# Patient Record
Sex: Female | Born: 1985 | Race: Black or African American | Hispanic: No | Marital: Single | Smoking: Current every day smoker
Health system: Southern US, Community
[De-identification: ages and names within clinical notes are randomized; demographics above are authoritative.]

## PROBLEM LIST (undated history)

## (undated) ENCOUNTER — Inpatient Hospital Stay (HOSPITAL_COMMUNITY): Payer: Self-pay

## (undated) DIAGNOSIS — K219 Gastro-esophageal reflux disease without esophagitis: Secondary | ICD-10-CM

## (undated) DIAGNOSIS — I10 Essential (primary) hypertension: Secondary | ICD-10-CM

---
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
## 2007-01-27 DIAGNOSIS — E669 Obesity, unspecified: Secondary | ICD-10-CM | POA: Insufficient documentation

## 2007-01-27 DIAGNOSIS — E119 Type 2 diabetes mellitus without complications: Secondary | ICD-10-CM | POA: Insufficient documentation

## 2007-04-26 ENCOUNTER — Telehealth (INDEPENDENT_AMBULATORY_CARE_PROVIDER_SITE_OTHER): Payer: Self-pay | Admitting: Family Medicine

## 2007-06-11 ENCOUNTER — Emergency Department (HOSPITAL_COMMUNITY): Admission: EM | Admit: 2007-06-11 | Discharge: 2007-06-11 | Payer: Self-pay | Admitting: Emergency Medicine

## 2008-05-18 ENCOUNTER — Encounter (INDEPENDENT_AMBULATORY_CARE_PROVIDER_SITE_OTHER): Payer: Self-pay | Admitting: Family Medicine

## 2008-05-18 ENCOUNTER — Ambulatory Visit: Payer: Self-pay | Admitting: Family Medicine

## 2008-05-18 ENCOUNTER — Telehealth: Payer: Self-pay | Admitting: *Deleted

## 2008-05-18 DIAGNOSIS — A630 Anogenital (venereal) warts: Secondary | ICD-10-CM | POA: Insufficient documentation

## 2008-05-18 LAB — CONVERTED CEMR LAB: Beta hcg, urine, semiquantitative: NEGATIVE

## 2008-05-19 LAB — CONVERTED CEMR LAB: GC Probe Amp, Genital: POSITIVE — AB

## 2008-05-21 ENCOUNTER — Encounter: Payer: Self-pay | Admitting: *Deleted

## 2008-05-25 ENCOUNTER — Encounter (INDEPENDENT_AMBULATORY_CARE_PROVIDER_SITE_OTHER): Payer: Self-pay | Admitting: *Deleted

## 2008-07-24 ENCOUNTER — Telehealth: Payer: Self-pay | Admitting: *Deleted

## 2008-07-24 ENCOUNTER — Ambulatory Visit: Payer: Self-pay | Admitting: Family Medicine

## 2008-07-24 DIAGNOSIS — E785 Hyperlipidemia, unspecified: Secondary | ICD-10-CM | POA: Insufficient documentation

## 2008-07-24 LAB — CONVERTED CEMR LAB: Beta hcg, urine, semiquantitative: POSITIVE

## 2008-08-03 ENCOUNTER — Telehealth (INDEPENDENT_AMBULATORY_CARE_PROVIDER_SITE_OTHER): Payer: Self-pay | Admitting: *Deleted

## 2008-08-07 ENCOUNTER — Encounter (INDEPENDENT_AMBULATORY_CARE_PROVIDER_SITE_OTHER): Payer: Self-pay | Admitting: Family Medicine

## 2008-08-09 ENCOUNTER — Ambulatory Visit: Payer: Self-pay | Admitting: Family Medicine

## 2008-08-09 ENCOUNTER — Encounter (INDEPENDENT_AMBULATORY_CARE_PROVIDER_SITE_OTHER): Payer: Self-pay | Admitting: Family Medicine

## 2008-08-09 LAB — CONVERTED CEMR LAB
Basophils Relative: 0 % (ref 0–1)
Eosinophils Relative: 1 % (ref 0–5)
Hemoglobin: 13.1 g/dL (ref 12.0–15.0)
MCHC: 34 g/dL (ref 30.0–36.0)
Monocytes Absolute: 1 10*3/uL (ref 0.1–1.0)
Monocytes Relative: 7 % (ref 3–12)
RDW: 13.9 % (ref 11.5–15.5)
Rubella: 15.5 intl units/mL — ABNORMAL HIGH
Sickle Cell Screen: NEGATIVE

## 2008-08-10 ENCOUNTER — Encounter (INDEPENDENT_AMBULATORY_CARE_PROVIDER_SITE_OTHER): Payer: Self-pay | Admitting: Family Medicine

## 2008-08-29 ENCOUNTER — Encounter (INDEPENDENT_AMBULATORY_CARE_PROVIDER_SITE_OTHER): Payer: Self-pay | Admitting: Family Medicine

## 2008-08-29 ENCOUNTER — Ambulatory Visit: Payer: Self-pay | Admitting: Family Medicine

## 2008-08-29 ENCOUNTER — Other Ambulatory Visit: Admission: RE | Admit: 2008-08-29 | Discharge: 2008-08-29 | Payer: Self-pay | Admitting: Family Medicine

## 2008-08-29 LAB — CONVERTED CEMR LAB
Chlamydia, DNA Probe: NEGATIVE
Cholesterol: 171 mg/dL (ref 0–200)
Hgb A1c MFr Bld: 7.1 %
Triglycerides: 57 mg/dL (ref <150)
Whiff Test: POSITIVE

## 2008-09-03 ENCOUNTER — Telehealth: Payer: Self-pay | Admitting: *Deleted

## 2008-09-05 ENCOUNTER — Encounter (INDEPENDENT_AMBULATORY_CARE_PROVIDER_SITE_OTHER): Payer: Self-pay | Admitting: Family Medicine

## 2008-09-13 ENCOUNTER — Telehealth (INDEPENDENT_AMBULATORY_CARE_PROVIDER_SITE_OTHER): Payer: Self-pay | Admitting: Family Medicine

## 2008-09-13 ENCOUNTER — Ambulatory Visit (HOSPITAL_COMMUNITY): Admission: RE | Admit: 2008-09-13 | Discharge: 2008-09-13 | Payer: Self-pay | Admitting: Family Medicine

## 2008-09-13 ENCOUNTER — Encounter (INDEPENDENT_AMBULATORY_CARE_PROVIDER_SITE_OTHER): Payer: Self-pay | Admitting: Family Medicine

## 2008-09-13 ENCOUNTER — Encounter (INDEPENDENT_AMBULATORY_CARE_PROVIDER_SITE_OTHER): Payer: Self-pay | Admitting: *Deleted

## 2008-09-17 ENCOUNTER — Inpatient Hospital Stay (HOSPITAL_COMMUNITY): Admission: AD | Admit: 2008-09-17 | Discharge: 2008-09-20 | Payer: Self-pay | Admitting: Obstetrics & Gynecology

## 2008-09-17 ENCOUNTER — Ambulatory Visit: Payer: Self-pay | Admitting: Obstetrics & Gynecology

## 2008-09-17 ENCOUNTER — Ambulatory Visit: Payer: Self-pay | Admitting: Family Medicine

## 2008-09-17 ENCOUNTER — Encounter: Payer: Self-pay | Admitting: Obstetrics & Gynecology

## 2008-09-17 ENCOUNTER — Encounter: Payer: Self-pay | Admitting: Family Medicine

## 2008-09-27 ENCOUNTER — Ambulatory Visit: Payer: Self-pay | Admitting: Obstetrics & Gynecology

## 2008-10-15 ENCOUNTER — Ambulatory Visit: Payer: Self-pay | Admitting: Obstetrics & Gynecology

## 2008-10-15 ENCOUNTER — Encounter: Admission: RE | Admit: 2008-10-15 | Discharge: 2009-01-13 | Payer: Self-pay | Admitting: Obstetrics & Gynecology

## 2008-10-15 ENCOUNTER — Encounter: Payer: Self-pay | Admitting: Family Medicine

## 2008-10-15 ENCOUNTER — Ambulatory Visit (HOSPITAL_COMMUNITY): Admission: RE | Admit: 2008-10-15 | Discharge: 2008-10-15 | Payer: Self-pay | Admitting: Obstetrics & Gynecology

## 2008-10-15 LAB — CONVERTED CEMR LAB
Chlamydia, DNA Probe: NEGATIVE
GC Probe Amp, Genital: NEGATIVE

## 2008-10-24 ENCOUNTER — Emergency Department (HOSPITAL_COMMUNITY): Admission: EM | Admit: 2008-10-24 | Discharge: 2008-10-24 | Payer: Self-pay | Admitting: Emergency Medicine

## 2008-10-29 ENCOUNTER — Ambulatory Visit: Payer: Self-pay | Admitting: Family Medicine

## 2008-10-30 ENCOUNTER — Ambulatory Visit (HOSPITAL_COMMUNITY): Admission: RE | Admit: 2008-10-30 | Discharge: 2008-10-30 | Payer: Self-pay | Admitting: Family Medicine

## 2008-11-19 ENCOUNTER — Ambulatory Visit: Payer: Self-pay | Admitting: Family Medicine

## 2008-11-19 LAB — CONVERTED CEMR LAB: Hgb A1c MFr Bld: 6 % (ref 4.6–6.1)

## 2008-12-12 ENCOUNTER — Ambulatory Visit (HOSPITAL_COMMUNITY): Admission: RE | Admit: 2008-12-12 | Discharge: 2008-12-12 | Payer: Self-pay | Admitting: Family Medicine

## 2008-12-20 ENCOUNTER — Ambulatory Visit: Payer: Self-pay | Admitting: Family Medicine

## 2009-01-07 ENCOUNTER — Encounter: Payer: Self-pay | Admitting: Family Medicine

## 2009-01-07 ENCOUNTER — Ambulatory Visit: Payer: Self-pay | Admitting: Obstetrics & Gynecology

## 2009-01-07 LAB — CONVERTED CEMR LAB
HCT: 36.6 % (ref 36.0–46.0)
Platelets: 331 10*3/uL (ref 150–400)
RBC: 3.81 M/uL — ABNORMAL LOW (ref 3.87–5.11)
RDW: 13.8 % (ref 11.5–15.5)
WBC: 11.6 10*3/uL — ABNORMAL HIGH (ref 4.0–10.5)

## 2009-01-15 ENCOUNTER — Ambulatory Visit (HOSPITAL_COMMUNITY): Admission: RE | Admit: 2009-01-15 | Discharge: 2009-01-15 | Payer: Self-pay | Admitting: Family Medicine

## 2009-01-28 ENCOUNTER — Ambulatory Visit: Payer: Self-pay | Admitting: Obstetrics & Gynecology

## 2009-02-11 ENCOUNTER — Ambulatory Visit: Payer: Self-pay | Admitting: Obstetrics & Gynecology

## 2009-02-11 ENCOUNTER — Encounter: Admission: RE | Admit: 2009-02-11 | Discharge: 2009-02-25 | Payer: Self-pay | Admitting: Obstetrics & Gynecology

## 2009-02-19 ENCOUNTER — Ambulatory Visit: Payer: Self-pay | Admitting: Obstetrics & Gynecology

## 2009-02-26 ENCOUNTER — Ambulatory Visit: Payer: Self-pay | Admitting: Obstetrics & Gynecology

## 2009-02-26 ENCOUNTER — Inpatient Hospital Stay (HOSPITAL_COMMUNITY): Admission: AD | Admit: 2009-02-26 | Discharge: 2009-03-04 | Payer: Self-pay | Admitting: Obstetrics & Gynecology

## 2009-02-26 ENCOUNTER — Ambulatory Visit: Payer: Self-pay | Admitting: Family Medicine

## 2009-02-27 ENCOUNTER — Encounter: Payer: Self-pay | Admitting: Obstetrics & Gynecology

## 2009-03-01 ENCOUNTER — Encounter: Payer: Self-pay | Admitting: Family Medicine

## 2009-03-21 ENCOUNTER — Ambulatory Visit: Payer: Self-pay | Admitting: Family Medicine

## 2009-08-17 ENCOUNTER — Encounter (INDEPENDENT_AMBULATORY_CARE_PROVIDER_SITE_OTHER): Payer: Self-pay | Admitting: *Deleted

## 2009-08-17 DIAGNOSIS — F17200 Nicotine dependence, unspecified, uncomplicated: Secondary | ICD-10-CM | POA: Insufficient documentation

## 2010-01-30 ENCOUNTER — Encounter: Payer: Self-pay | Admitting: Family Medicine

## 2010-01-30 ENCOUNTER — Encounter: Payer: Self-pay | Admitting: Sports Medicine

## 2010-01-30 ENCOUNTER — Ambulatory Visit: Payer: Self-pay | Admitting: Family Medicine

## 2010-01-30 DIAGNOSIS — A599 Trichomoniasis, unspecified: Secondary | ICD-10-CM | POA: Insufficient documentation

## 2010-01-30 LAB — CONVERTED CEMR LAB
Bilirubin Urine: NEGATIVE
Blood in Urine, dipstick: NEGATIVE
Glucose, Urine, Semiquant: 100
Nitrite: NEGATIVE
Protein, U semiquant: NEGATIVE
Specific Gravity, Urine: 1.025
Urobilinogen, UA: 0.2
Whiff Test: POSITIVE
pH: 7

## 2010-01-31 ENCOUNTER — Telehealth: Payer: Self-pay | Admitting: *Deleted

## 2010-01-31 DIAGNOSIS — Z8619 Personal history of other infectious and parasitic diseases: Secondary | ICD-10-CM | POA: Insufficient documentation

## 2010-01-31 LAB — CONVERTED CEMR LAB
Chlamydia, DNA Probe: POSITIVE — AB
GC Probe Amp, Genital: NEGATIVE

## 2010-02-03 ENCOUNTER — Ambulatory Visit: Payer: Self-pay | Admitting: Family Medicine

## 2010-02-17 ENCOUNTER — Emergency Department (HOSPITAL_COMMUNITY): Admission: EM | Admit: 2010-02-17 | Discharge: 2010-02-17 | Payer: Self-pay | Admitting: Emergency Medicine

## 2010-02-20 ENCOUNTER — Emergency Department (HOSPITAL_COMMUNITY): Admission: EM | Admit: 2010-02-20 | Discharge: 2010-02-20 | Payer: Self-pay | Admitting: Emergency Medicine

## 2010-05-07 ENCOUNTER — Encounter: Payer: Self-pay | Admitting: Family Medicine

## 2010-07-04 IMAGING — US US OB COMP LESS 14 WK
1 series · 14 of 24 positions shown · non-contrast
Comparison: none

OBSTETRICAL ULTRASOUND:
 This ultrasound exam was performed in the [HOSPITAL] Ultrasound Department.  The OB US report was generated in the AS system, and faxed to the ordering physician.  This report is also available in [REDACTED] PACS.

[Series 1: us ob comp less 14 wks · 14 of 24 slices shown]
[im 1/24]
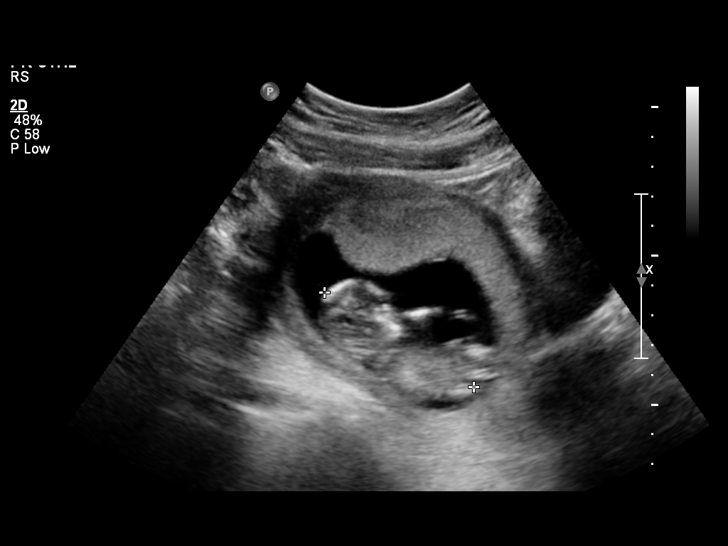
[im 3/24]
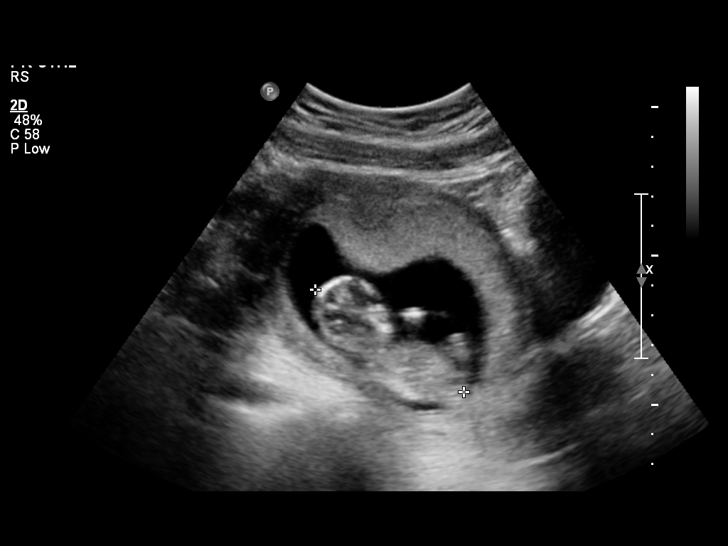
[im 5/24]
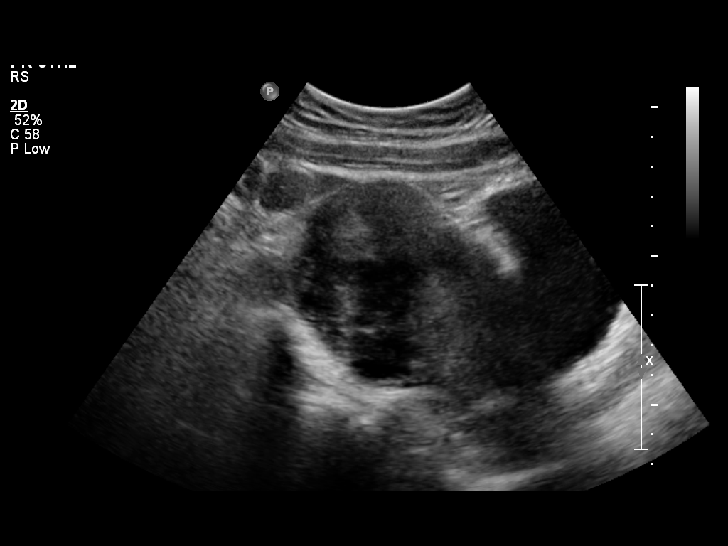
[im 7/24]
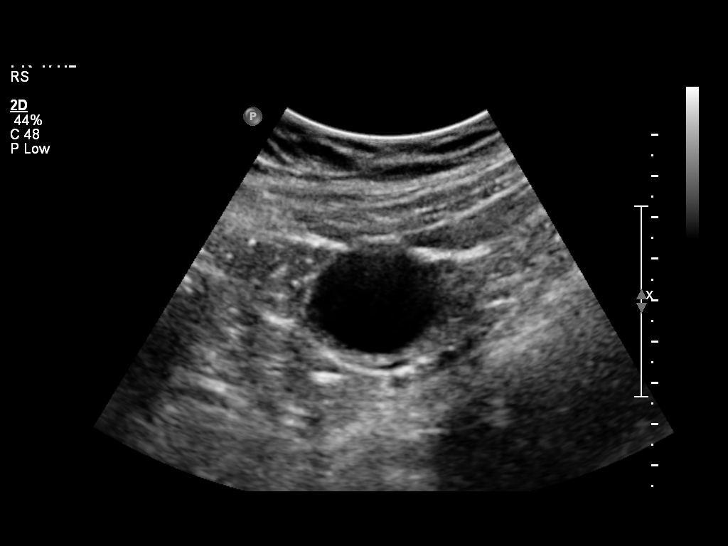
[im 8/24]
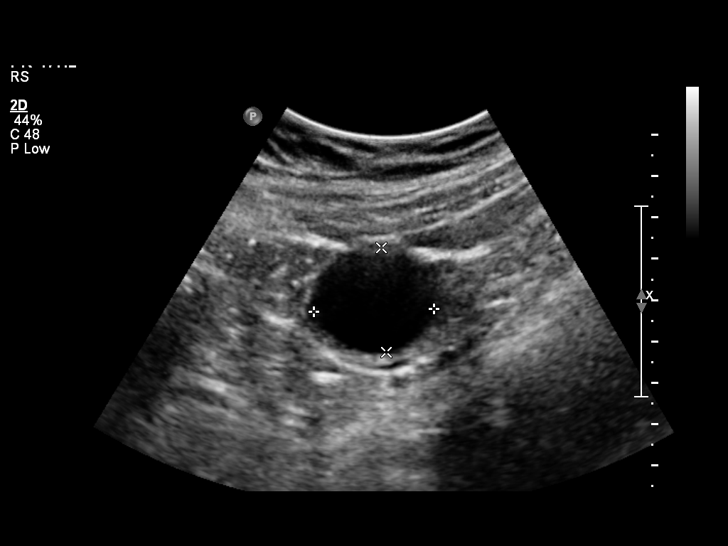
[im 10/24]
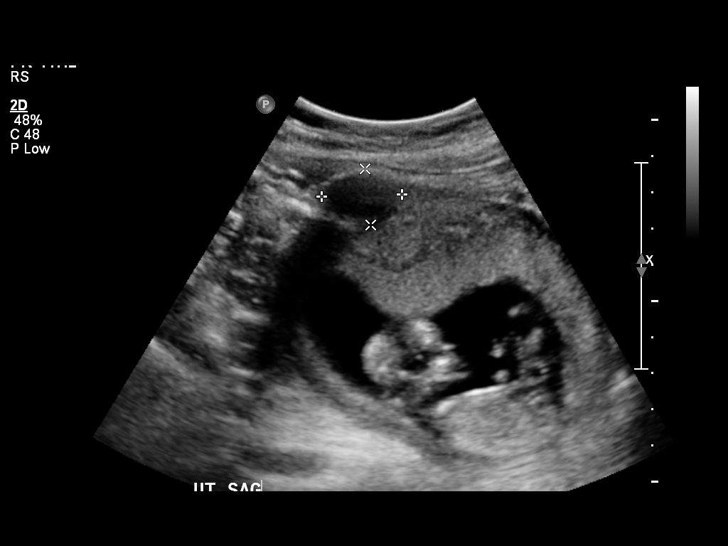
[im 12/24]
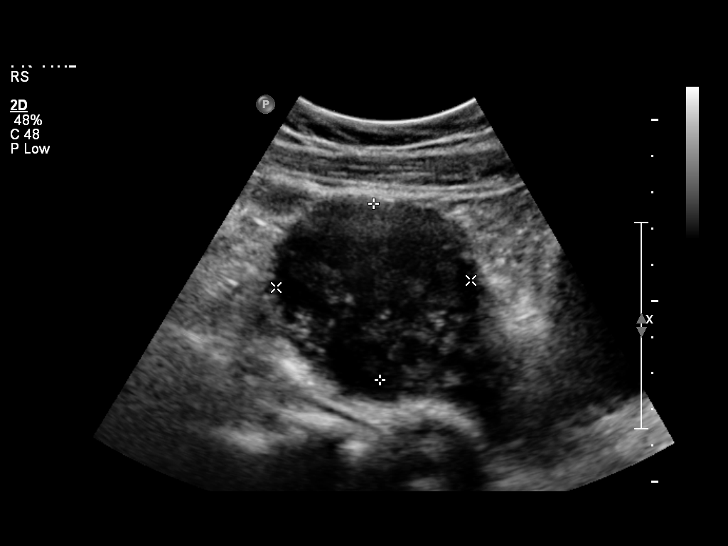
[im 13/24]
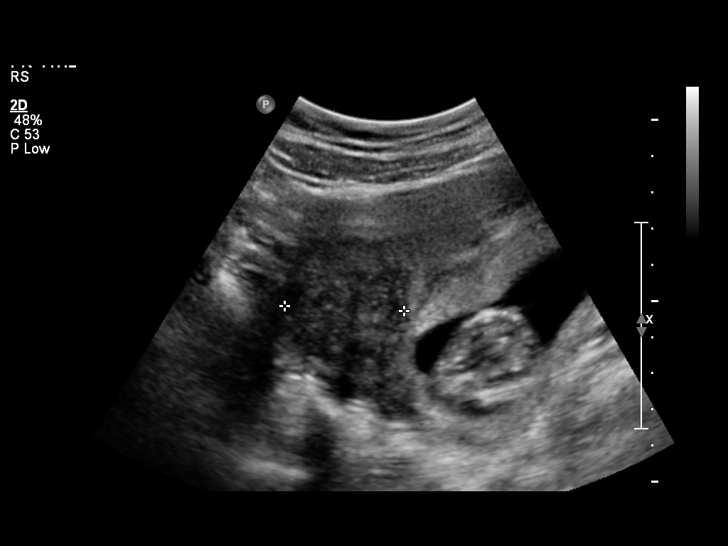
[im 15/24]
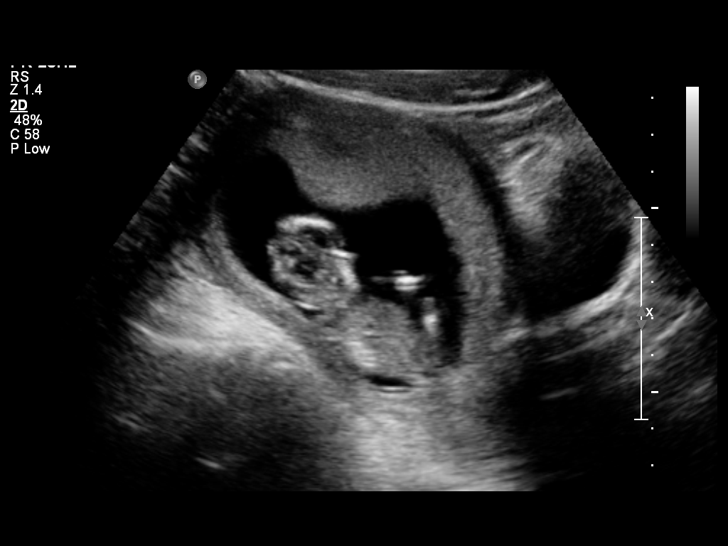
[im 17/24]
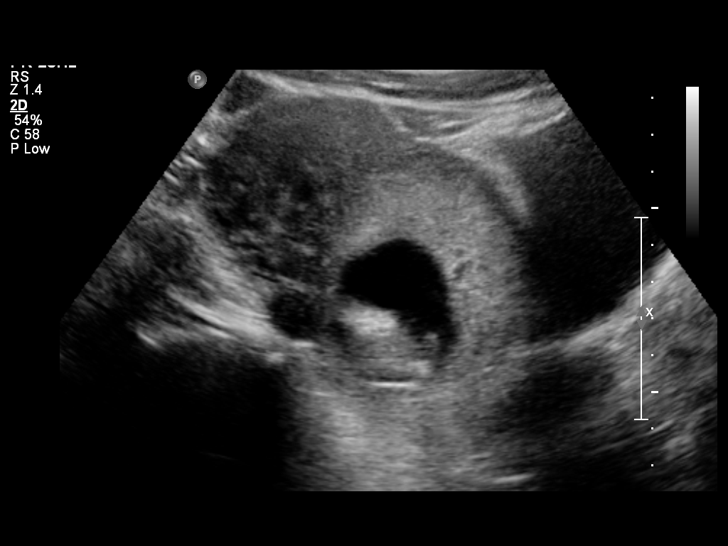
[im 19/24]
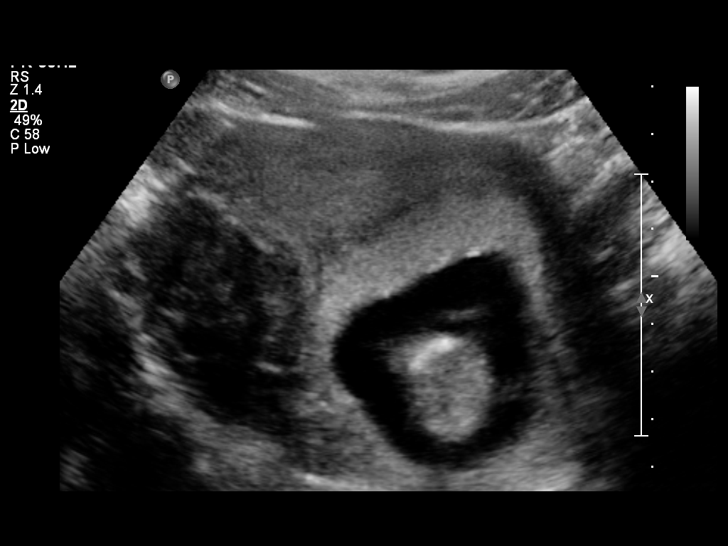
[im 20/24]
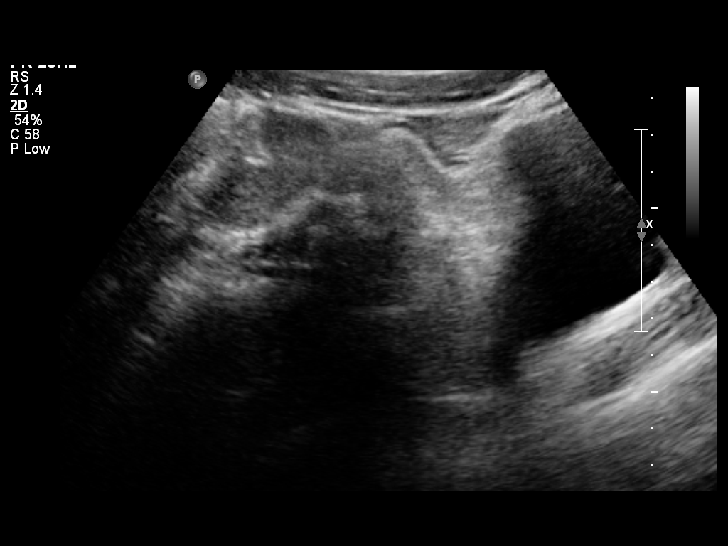
[im 22/24]
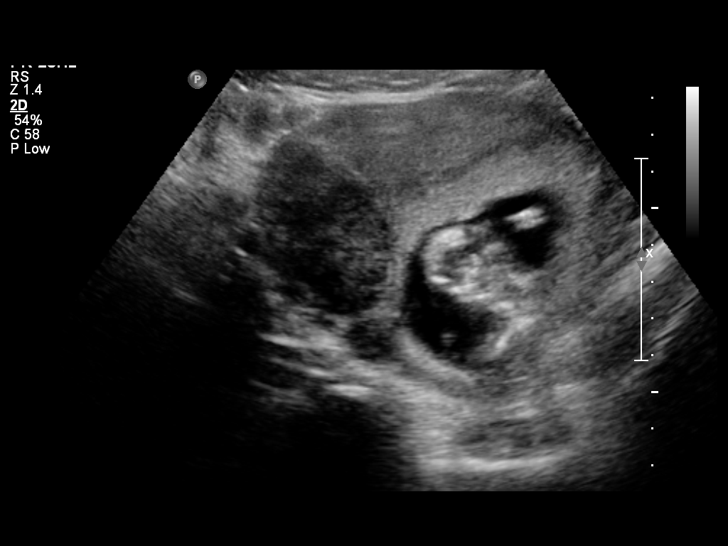
[im 24/24]
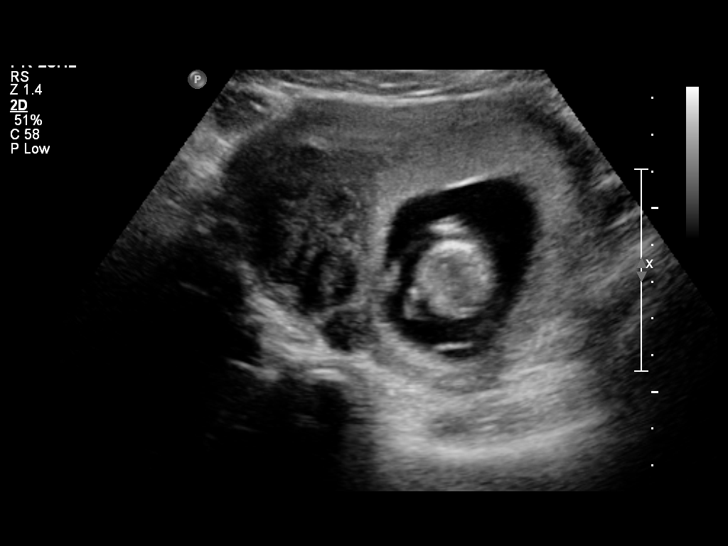

[14 of 24 positions shown; findings below may reference images not displayed]

IMPRESSION: See AS Obstetric US report.

## 2010-07-15 ENCOUNTER — Ambulatory Visit: Payer: Self-pay | Admitting: Family Medicine

## 2010-07-18 ENCOUNTER — Ambulatory Visit: Payer: Self-pay | Admitting: Family Medicine

## 2010-12-21 ENCOUNTER — Encounter: Payer: Self-pay | Admitting: *Deleted

## 2011-01-01 NOTE — Letter (Signed)
Summary: Generic Letter  [facility]  [address]   [place], [place] [address]   Phone: [phone]  Fax: [phone]    05/07/2010  SAKEENA TEALL [address] [place], [place]  [address]  Dear Ms. Shiley,    We have not seen you for routine medical care in over a year.  It is important to receive regular medical care for your diabetes and cholesterol.  Please call to schedule an appointment.       Sincerely,   Eustaquio Boyden  MD  Appended Document: Generic Letter letter mailed.

## 2011-01-01 NOTE — Assessment & Plan Note (Signed)
Summary: std check/Shellman/Gutierrez's   Vital Signs:  Patient profile:   25 year old female Height:      64 inches Weight:      193 pounds Pulse rate:   89 / minute BP sitting:   141 / 92  Vitals Entered By: Jone Baseman CMA (January 30, 2010 10:52 AM) CC: STD test Pain Assessment Patient in pain? no        Primary Care Provider:  Sylvan Cheese MD  CC:  STD test.  History of Present Illness: VAGINAL DISCHARGE Onset: 1 week Description: yellowish, smelly.  Symptoms Odor: yes, offensive Itching: no Vaginal burning: no Dysuria: no Bleeding: no Pelvic pain: no Back pain: no Fever: no Genital sores: no Rash: no Dyspareunia: no GI Symptoms: no  Red Flags:  Missed period: no Recent antibiotics: no Possible STD exposure: no IUD: no Diabetes: yes, well controlled A1c <6.  No N/V/D/fevers/chills.  Last intercourse 1 week ago, always uses condoms.  Last period ended last week.     Habits & Providers  Alcohol-Tobacco-Diet     Tobacco Status: current     Tobacco Counseling: to quit use of tobacco products     Cigarette Packs/Day: 0.25  Current Medications (verified): 1)  Maternity  Tabs (Prenatal Vit-Sel-Fe Fum-Fa) .Marland Kitchen.. 1 Tablet By Mouth Daily 2)  Sprintec 28 0.25-35 Mg-Mcg Tabs (Norgestimate-Eth Estradiol) .Marland Kitchen.. 1 Tablet By Mouth Daily For Birth Control 3)  Metformin Hcl 500 Mg Tabs (Metformin Hcl) .Marland Kitchen.. 1 Tablet By Mouth Two Times A Day For Diabetes  Allergies (verified): No Known Drug Allergies  Social History: Packs/Day:  0.25  Review of Systems       See HPI  Physical Exam  General:  Well-developed,well-nourished,in no acute distress; alert,appropriate and cooperative throughout examination Abdomen:  Bowel sounds positive,abdomen soft and non-tender without masses, organomegaly or hernias noted. Genitalia:  Normal introitus for age, no external lesions, no vaginal discharge, mucosa pink and moist, no vaginal or cervical lesions, no vaginal atrophy, no  friaility or hemorrhage, normal uterus size and position, no adnexal masses or tenderness   Impression & Recommendations:  Problem # 1:  SEXUALLY TRANSMITTED DISEASE, EXPOSURE TO (ICD-V01.6) Assessment New Trich on urine specimen.  Also clues.  Will tx with flagyl 500 two times a day x 7 d to cover BV and trich.  Called and discussed this with pt on phone.  She will go pick up her scripts. Will discuss tx of sex partner with PCP.  Awaiting further bloodwork.  Orders: Wet Prep- [facility] [phone]) GC/Chlamydia-[facility] (87591/87491) Urinalysis-[facility] (00000) HIV-[facility] (95621-30865) HSV 2- [facility] (78469-62952) RPR-[facility] (84132-44010) [facility]- Est Level  3 (27[address])  Problem # 2:  TRICHOMONIASIS (ICD-131.9) Assessment: New See #1.  Complete Medication List: 1)  Maternity Tabs (Prenatal vit-sel-fe fum-fa) .Marland Kitchen.. 1 tablet by mouth daily 2)  Sprintec 28 0.25-35 Mg-mcg Tabs (Norgestimate-eth estradiol) .Marland Kitchen.. 1 tablet by mouth daily for birth control 3)  Metformin Hcl 500 Mg Tabs (Metformin hcl) .Marland Kitchen.. 1 tablet by mouth two times a day for diabetes 4)  Metronidazole 500 Mg Tabs (Metronidazole) .... One tab by mouth two times a day x 7 days Prescriptions: METRONIDAZOLE 500 MG TABS (METRONIDAZOLE) One tab by mouth two times a day x 7 days  #14 x 0   Entered and Authorized by:   Rodney Langton MD   Signed by:   Rodney Langton MD on 01/30/2010   Method used:   Electronically to        [id number] (retail)       [address], [place]  [address]       Ph: [phone] or [phone]       Fax: [phone]   RxID:   [id number]   Laboratory Results   Urine Tests  Date/Time Received: January 30, 2010 11:20 AM  Date/Time Reported: January 30, 2010 11:46 AM   Routine Urinalysis   Color: yellow Appearance: Clear Glucose: 100   (Normal Range: Negative) Bilirubin: negative   (Normal Range: Negative) Ketone: trace (5)   (Normal Range: Negative) Spec. Gravity: 1.025   (Normal Range:  1.003-1.035) Blood: negative   (Normal Range: Negative) pH: 7.0   (Normal Range: 5.0-8.0) Protein: negative   (Normal Range: Negative) Urobilinogen: 0.2   (Normal Range: 0-1) Nitrite: negative   (Normal Range: Negative) Leukocyte Esterace: trace   (Normal Range: Negative)  Urine Microscopic WBC/HPF: 10-20 RBC/HPF: rare Bacteria/HPF: 3+ Epithelial/HPF: 0-3 Other: occ trich    Comments: ...........test performed by...........Marland KitchenTerese Door, CMA  Date/Time Received: January 30, 2010 11:07 AM  Date/Time Reported: January 30, 2010 11:16 AM   [place] Source: vaginal WBC/hpf: 5-20 Bacteria/hpf: 3+  Cocci Clue cells/hpf: few  Positive whiff Yeast/hpf: none Trichomonas/hpf: none Comments: Occ RBC present; Rod bacteria also present ...........test performed by...........Marland KitchenTerese Door, CMA

## 2011-01-01 NOTE — Miscellaneous (Signed)
Summary: walk in   Clinical Lists Changes c/o vag discharge x 1 wk. yellow in color. usues condoms for sex. work in at [employer] for [employer].Golden Circle RN  January 30, 2010 10:29 AM

## 2011-01-01 NOTE — Assessment & Plan Note (Signed)
Summary: read ppd/bmc   Nurse Visit   Allergies: No Known Drug Allergies  PPD Results    Date of reading: 07/18/2010    Results: 0 mm    Interpretation: negative  Orders Added: 1)  No Charge Patient Arrived (NCPA0) [NCPA0]

## 2011-01-01 NOTE — Assessment & Plan Note (Signed)
Summary: std tx,df   Nurse Visit   Allergies: No Known Drug Allergies  Medication Administration  Medication # 1:    Medication: Azithromycin oral    Diagnosis: CERVICITIS, CHLAMYDIAL (ICD-616.0)    Dose: 1 Gram    Route: po    Exp Date: 10/30/2010    Lot #: [id number]    Mfr: [email or web address]    Patient tolerated medication without complications    Given by: Theresia Lo RN (February 03, 2010 10:10 AM)  Orders Added: 1)  Azithromycin oral [Q0144] 2)  Est Level 1- [facility] [[id number]]   Medication Administration  Medication # 1:    Medication: Azithromycin oral    Diagnosis: CERVICITIS, CHLAMYDIAL (ICD-616.0)    Dose: 1 Gram    Route: po    Exp Date: 10/30/2010    Lot #: [id number]    Mfr: [email or web address]    Patient tolerated medication without complications    Given by: Theresia Lo RN (February 03, 2010 10:10 AM)  Orders Added: 1)  Azithromycin oral [Q0144] 2)  Est Level 1- [facility] [[id number]]   patient advised to abstain form sex for 7 days, always use condoms to prevent STD and to tell partner to be treated. Theresia Lo RN  February 03, 2010 10:13 AM

## 2011-01-01 NOTE — Progress Notes (Signed)
Summary: Attempt to convey positive test results.   Phone Note Outgoing Call   Call placed by: Rodney Langton MD,  January 31, 2010 3:23 PM Call placed to: Patient Summary of Call: Called pt's home and work numbers multiple times, mailbox full on home phone, work phone is someone who has not idea who pt is.  Attempting to give results of chlamydia test.  Pt needs to come to office for 1g azithromycin by mouth x1.  Will have certified letter sent to pt. Initial call taken by: Rodney Langton MD,  January 31, 2010 3:24 PM    pt called and made appt to be treated.Loralee Pacas CMA  February 03, 2010 11:58 AM

## 2011-01-01 NOTE — Miscellaneous (Signed)
Summary: Patient summary   Clinical Lists Changes  Problems: Removed problem of POSTPARTUM EXAMINATION (ICD-V24.2)    I've never met patient.  did send letter asking her to schedule OV to f/u chronic issues including DM.

## 2011-01-01 NOTE — Progress Notes (Signed)
Summary: phn msg   Phone Note Call from Patient Call back at [phone]   Caller: Patient Summary of Call: Pt returning call to Dr. Megan Salon. Initial call taken by: Clydell Hakim,  January 31, 2010 4:52 PM  Follow-up for Phone Call        Please have her come to the office to take Azithromycin 1g by mouth x1.  She has a positive chlamydia test.  I also want to see her at my next slot to discuss treatment of partners and test of cure in 6 weeks for her. Follow-up by: Rodney Langton MD,  January 31, 2010 5:39 PM    called and spoke with pt.about her results and told her that she will need to come in and be treated and to make an appt with Dr. Karie Schwalbe, she agreed.Loralee Pacas CMA  February 03, 2010 8:49 AM    Appended Document: phn msg   spoke with pt about positive STI results and informed her that she will need to come in and get treated and to schedule an appt with Dr. Karie Schwalbe pt agreed to this results faxed to [facility] [phone].Loralee Pacas CMA  February 03, 2010 8:59 AM   Clinical Lists Changes

## 2011-01-01 NOTE — Assessment & Plan Note (Signed)
Summary: tb test,df  Nurse Visit   Allergies: No Known Drug Allergies  Immunizations Administered:  PPD Skin Test:    Vaccine Type: PPD    Site: right forearm    Mfr: Sanofi Pasteur    Dose: 0.1 ml    Route: ID    Given by: Patricia Smith RN    Exp. Date: 10/02/2011    Lot #: [id number]  Orders Added: 1)  TB Skin Test [86580] 2)  Admin 1st Vaccine [90471] 

## 2011-01-19 ENCOUNTER — Encounter: Payer: Self-pay | Admitting: *Deleted

## 2011-02-22 LAB — GLUCOSE, CAPILLARY: Glucose-Capillary: 124 mg/dL — ABNORMAL HIGH (ref 70–99)

## 2011-03-06 ENCOUNTER — Inpatient Hospital Stay (INDEPENDENT_AMBULATORY_CARE_PROVIDER_SITE_OTHER)
Admission: RE | Admit: 2011-03-06 | Discharge: 2011-03-06 | Disposition: A | Discharge status: Home / Self Care | Payer: Medicaid Other | Source: Ambulatory Visit | Attending: Emergency Medicine | Admitting: Emergency Medicine

## 2011-03-06 DIAGNOSIS — N898 Other specified noninflammatory disorders of vagina: Secondary | ICD-10-CM

## 2011-03-06 LAB — WET PREP, GENITAL

## 2011-03-06 LAB — POCT URINALYSIS DIP (DEVICE)
Hgb urine dipstick: NEGATIVE
Ketones, ur: NEGATIVE mg/dL
Protein, ur: NEGATIVE mg/dL
Specific Gravity, Urine: 1.025 (ref 1.005–1.030)
Urobilinogen, UA: 0.2 mg/dL (ref 0.0–1.0)
pH: 6.5 (ref 5.0–8.0)

## 2011-03-06 LAB — POCT PREGNANCY, URINE: Preg Test, Ur: NEGATIVE

## 2011-03-09 LAB — GC/CHLAMYDIA PROBE AMP, GENITAL: Chlamydia, DNA Probe: NEGATIVE

## 2011-03-11 LAB — CBC
MCHC: 33 g/dL (ref 30.0–36.0)
MCV: 100.4 fL — ABNORMAL HIGH (ref 78.0–100.0)
MCV: 99.5 fL (ref 78.0–100.0)
Platelets: 218 10*3/uL (ref 150–400)
Platelets: 272 10*3/uL (ref 150–400)
Platelets: 299 10*3/uL (ref 150–400)
RBC: 3.16 MIL/uL — ABNORMAL LOW (ref 3.87–5.11)
RBC: 4.08 MIL/uL (ref 3.87–5.11)
RDW: 14.2 % (ref 11.5–15.5)
WBC: 14.1 10*3/uL — ABNORMAL HIGH (ref 4.0–10.5)
WBC: 21.8 10*3/uL — ABNORMAL HIGH (ref 4.0–10.5)

## 2011-03-11 LAB — GLUCOSE, CAPILLARY
Glucose-Capillary: 105 mg/dL — ABNORMAL HIGH (ref 70–99)
Glucose-Capillary: 109 mg/dL — ABNORMAL HIGH (ref 70–99)
Glucose-Capillary: 109 mg/dL — ABNORMAL HIGH (ref 70–99)
Glucose-Capillary: 118 mg/dL — ABNORMAL HIGH (ref 70–99)
Glucose-Capillary: 120 mg/dL — ABNORMAL HIGH (ref 70–99)
Glucose-Capillary: 128 mg/dL — ABNORMAL HIGH (ref 70–99)
Glucose-Capillary: 47 mg/dL — ABNORMAL LOW (ref 70–99)
Glucose-Capillary: 84 mg/dL (ref 70–99)
Glucose-Capillary: 88 mg/dL (ref 70–99)
Glucose-Capillary: 89 mg/dL (ref 70–99)
Glucose-Capillary: 93 mg/dL (ref 70–99)
Glucose-Capillary: 93 mg/dL (ref 70–99)
Glucose-Capillary: 94 mg/dL (ref 70–99)

## 2011-03-11 LAB — RPR: RPR Ser Ql: NONREACTIVE

## 2011-03-12 LAB — GLUCOSE, CAPILLARY
Glucose-Capillary: 106 mg/dL — ABNORMAL HIGH (ref 70–99)
Glucose-Capillary: 108 mg/dL — ABNORMAL HIGH (ref 70–99)
Glucose-Capillary: 55 mg/dL — ABNORMAL LOW (ref 70–99)
Glucose-Capillary: 57 mg/dL — ABNORMAL LOW (ref 70–99)
Glucose-Capillary: 61 mg/dL — ABNORMAL LOW (ref 70–99)
Glucose-Capillary: 61 mg/dL — ABNORMAL LOW (ref 70–99)
Glucose-Capillary: 74 mg/dL (ref 70–99)
Glucose-Capillary: 92 mg/dL (ref 70–99)
Glucose-Capillary: 95 mg/dL (ref 70–99)

## 2011-03-12 LAB — CBC
HCT: 35.9 % — ABNORMAL LOW (ref 36.0–46.0)
MCV: 98.5 fL (ref 78.0–100.0)
Platelets: 277 10*3/uL (ref 150–400)
RDW: 14.1 % (ref 11.5–15.5)
WBC: 11.3 10*3/uL — ABNORMAL HIGH (ref 4.0–10.5)

## 2011-03-12 LAB — CREATININE, URINE, 24 HOUR
Collection Interval-UCRE24: 24 hours
Urine Total Volume-UCRE24: 1950 mL

## 2011-03-12 LAB — PROTEIN, URINE, 24 HOUR
Protein, 24H Urine: 117 mg/d — ABNORMAL HIGH (ref 50–100)
Urine Total Volume-UPROT: 1950 mL

## 2011-03-12 LAB — URINALYSIS, ROUTINE W REFLEX MICROSCOPIC
Bilirubin Urine: NEGATIVE
Nitrite: NEGATIVE
Protein, ur: NEGATIVE mg/dL
Specific Gravity, Urine: 1.015 (ref 1.005–1.030)
Urobilinogen, UA: 0.2 mg/dL (ref 0.0–1.0)

## 2011-03-12 LAB — POCT URINALYSIS DIP (DEVICE)
Bilirubin Urine: NEGATIVE
Glucose, UA: NEGATIVE mg/dL
Hgb urine dipstick: NEGATIVE
Ketones, ur: NEGATIVE mg/dL
Protein, ur: NEGATIVE mg/dL
Specific Gravity, Urine: 1.015 (ref 1.005–1.030)
Specific Gravity, Urine: 1.02 (ref 1.005–1.030)
Urobilinogen, UA: 0.2 mg/dL (ref 0.0–1.0)
pH: 6.5 (ref 5.0–8.0)
pH: 7 (ref 5.0–8.0)

## 2011-03-12 LAB — COMPREHENSIVE METABOLIC PANEL
AST: 18 U/L (ref 0–37)
Albumin: 2.5 g/dL — ABNORMAL LOW (ref 3.5–5.2)
BUN: 2 mg/dL — ABNORMAL LOW (ref 6–23)
Chloride: 108 mEq/L (ref 96–112)
Creatinine, Ser: 0.4 mg/dL (ref 0.4–1.2)
GFR calc Af Amer: >60 mL/min (ref >60)
Total Bilirubin: 0.5 mg/dL (ref 0.3–1.2)
Total Protein: 5.9 g/dL — ABNORMAL LOW (ref 6.0–8.3)

## 2011-03-12 LAB — STREP B DNA PROBE: Strep Group B Ag: NEGATIVE

## 2011-03-12 LAB — LSPG (L/S RATIO WITH PG)-AMNIO FLUID

## 2011-03-12 LAB — GC/CHLAMYDIA PROBE AMP, URINE
Chlamydia, Swab/Urine, PCR: NEGATIVE
GC Probe Amp, Urine: NEGATIVE

## 2011-03-12 LAB — PROTEIN, URINE, RANDOM: Total Protein, Urine: 8 mg/dL

## 2011-03-16 LAB — POCT URINALYSIS DIP (DEVICE)
Glucose, UA: NEGATIVE mg/dL
Hgb urine dipstick: NEGATIVE
Ketones, ur: NEGATIVE mg/dL
Protein, ur: 30 mg/dL — AB
Specific Gravity, Urine: 1.015 (ref 1.005–1.030)

## 2011-03-17 LAB — POCT URINALYSIS DIP (DEVICE)
Bilirubin Urine: NEGATIVE
Glucose, UA: NEGATIVE mg/dL
Hgb urine dipstick: NEGATIVE
Specific Gravity, Urine: 1.02 (ref 1.005–1.030)
pH: 7.5 (ref 5.0–8.0)

## 2011-04-14 NOTE — Op Note (Signed)
Vickie Gonzales, [name]               ACCOUNT NO.:  [id number]   MEDICAL RECORD NO.:  [id number]          PATIENT TYPE:  INP   LOCATION:  9305                          FACILITY:  [facility]   PHYSICIAN:  Tanya S. Shawnie Pons, M.D.   DATE OF BIRTH:  03-Feb-1986   DATE OF PROCEDURE:  03/01/2009  DATE OF DISCHARGE:                               OPERATIVE REPORT   PREOPERATIVE DIAGNOSES:  Intrauterine pregnancy at 36 weeks, diabetes  mellitus class D, intrauterine growth restriction, poor compliance, and  nonreassuring fetal heart rate tracing.   POSTOPERATIVE DIAGNOSES:  Intrauterine pregnancy at 36 weeks, diabetes  mellitus class D, intrauterine growth restriction, poor compliance, and  nonreassuring fetal heart rate tracing.   PROCEDURE:  Primary low-transverse cesarean section.   SURGEON:  Shelbie Proctor. Shawnie Pons, MD   ANESTHESIA:  Epidural and local.   FINDINGS:  Viable female infant, Apgars 4 and 8, weight 4 pounds 5 ounces,  pH 7.07.   SPECIMENS:  Placenta to Pathology.   ESTIMATED BLOOD LOSS:  1000 mL.   COMPLICATIONS:  None known.   REASON FOR PROCEDURE:  Briefly, the patient is a 25 year old gravida 2,  para 0 who had not been in the clinic for approximately 3 weeks who  showed up with a blood sugar of 220.  She was subsequently admitted,  where with diet and insulin regimen, she promptly bought in down her  blood sugars and then an ultrasound revealed IUGR less than 10% with  normal Dopplers and normal fluid.  Because of all this, MFM consult was  obtained because of her poor compliance and a sort of hostile uterine  environment, it was recommended that the patient undergo amniocentesis  for a fetal lung maturity with induction of labor post this.  The tap  went well.  She had mature lungs and was scheduled for induction.  She  underwent Foley bulb placement which came out and then Pitocin was used  to induce labor.  The patient got  to 7-8 cm and had been ruptured for  approximately 4-1/2 to  5 hours when she developed fever and fetal  tachycardia.  She was started on ampicillin and gentamicin aiming that  she was GBS negative.  She also developed some variable decels.  Her  pitocin was turned off.  An IUPC was placed.  Amnioinfusion was started.  After prompt  recovery and excellent fetal heart rate tracing, the baby  suddenly had a bradycardia.  The heart rate was down for approximately 8-  9 minutes.  The patient was placed in knee chest  with no significant  improvement in the heart rate and that was in the 60s.  After been down  for approximately 12 minutes in the room, decision was made to take her  to the OR.  Upon entering into the OR and moving her to the bed, heart  rate was assessed and was found to be 75, so urgent cesarean section was  done, and from incision to baby out was approximately 1 minute.  Infant  was vertex.  There was a cord that came out of the  incision,  but was  certainly not down past the head.  The infant was brought up and out.  Cord was clamped x2 and given to awaiting peds.  Cord pH and cord blood  were then obtained.  The placenta was delivered with traction on the  cord.  The uterus was cleaned with a dry lap pad.  Edge of the uterine  incision was closed with a 0 Vicryl suture in a locked running fashion.  A second imbricating layer was then used until hemostasis was obtained.  Attention then turned to the fascia which was closed with 0 Vicryl  suture in a running fashion.  Subcutaneous tissue was inspected felt to  be hemostatic and skin closed using clips.  A 30 mL of 0.25% Marcaine  were injected above the incision.  Pressure dressing was then placed on  top of this and the patient was taken to the recovery room after all  needle, instrument, and lap counts were correct x2.  The infant went to  the NICU secondary to weight issues.      Shelbie Proctor. Shawnie Pons, M.D.  Electronically Signed     TSP/MEDQ  D:  03/01/2009  T:  03/02/2009  Job:   [id number]

## 2011-04-14 NOTE — Discharge Summary (Signed)
[name], Vickie Gonzales NO.:  [id number]   MEDICAL RECORD NO.:  [id number]         PATIENT TYPE:  WINP   LOCATION:                                [facility]ILITY:  [facility]   PHYSICIAN:  Allie Bossier, MD        DATE OF BIRTH:  1986/09/09   DATE OF ADMISSION:  09/17/2008  DATE OF DISCHARGE:  09/20/2008                               DISCHARGE SUMMARY   [name] is a 25 year old gravida 2, para 0-0-2-0 who was admitted at  approximately 4 weeks' gestational age [facility] with poor  glycemic control.  [name] has been a type 2 diabetic since the age  of 33 and has been previously treated with metformin.  She has stopped  this medication since becoming pregnant.  She was admitted to the  hospital for baseline labs as well as starting and titrating insulin to  a desired effect.   HOSPITAL COURSE:  The patient was admitted for observation, and her  hospital course was essentially uncomplicated.  Her insulin had to be  titrated up to a final dose of 24 NPH, 14 regular every morning, 14  regular at dinner time, and 18 NPH at bedtime.  She had a 24-hour urine,  which revealed 95 mg of protein in 24 hours.  The rest of her labs were  essentially unremarkable.  At the time of discharge, she was in good  condition without any complaints.  She had appropriate nutrition, diet  as well as, insulin and diabetic teaching and is in good understanding  of how to give shots and how to check blood sugars.  The patient will be  discharged home on that insulin regimen and will follow up in [facility] on Monday, September 24, 2008, for further evaluations of her blood  sugars.  The patient has understanding of all instructions and all her  questions have been answered.      Odie Sera, DO  Electronically Signed     ______________________________  Allie Bossier, MD    MC/MEDQ  D:  09/20/2008  T:  09/20/2008  Job:  [id number]

## 2011-04-14 NOTE — Discharge Summary (Signed)
Vickie Gonzales, Vickie Gonzales               ACCOUNT NO.:  [id number]   MEDICAL RECORD NO.:  [id number]          PATIENT TYPE:  INP   LOCATION:  9305                          [facility]ILITY[facility]   PHYSICIAN:  Norton Blizzard, MD    DATE OF BIRTH:  September 15, 1986   DATE OF ADMISSION:  02/26/2009  DATE OF DISCHARGE:  03/04/2009                               DISCHARGE SUMMARY   DISCHARGE DIAGNOSES:  1. Poorly controlled insulin-dependent diabetes mellitus.  2. Postpartum, status post primary low transverse cesarean section      after nonreassuring fetal heart rate with deceleration down into      the 60s for about 9 minutes.   DISCHARGE MEDICATIONS:  Prenatal vitamins, Motrin 600 mg p.o. q.6 h.  p.r.n. pain, and the patient will be restarted on her metformin.   PERTINENT STUDIES:  Urinalysis was completely negative except for urine  glucose of 215.  She had a CBC on February 26, 2009, the day of admission  with the hemoglobin of 11.9, hematocrit of 35.9, and platelet count of  277 with a white blood cell count of 11.3.  Her complete metabolic panel  was normal. Her 24-hour urine total volume was 1950 mL,  total  creatinine was 1637 and total urine protein of 117 .  GC and chlamydia  were both negative.  GBS was negative.  She had an RPR that was  nonreactive and on the day 2 days prior to discharge, the patient had a  CBC of 10.5, hemoglobin with hematocrit of 31.8, platelets of 218, and a  white blood cell count of 21.8.   HOSPTIAL COURSE:  The patient was admitted on the Antenatal Unit for  control of her diabetes and had several labs drawn while she was here.  The patient's blood sugars were well controlled when she was put on an  insulin regimen, Metformin,  and kept on an ADA diet.  The patient did  experience some hypoglycemia episodes while on the floor.  Her  medications were titrated down to optimal doses.  The patient had  ultrasounds, BPP and cord Dopplers done on March 30 that showed  intrauterine growth restriction.  She also had ultrasound-guided  amniocentesis which was positive for fetal lung maturity.  Once the  patient  was determined to have positive fetal lung maturity, the  patient was transferred to L and D and the induction of labor was begun.  The patient initially had a Foley bulb cervical ripening which was  successful and Cytotec was started. She was also started on the glucose  stabilizer. The patient continued to do well until she had about a 9-  minute fetal heart rate deceleration.  At this time, the patient was  repositioned, put on her knees, and the fetus heart rate did not come  back up, so the patient was rushed to cesarean section.  Please see the  C-section dictation for further information.  Of note, the patient had  developed a slight elevation in temperature to 100.2 during the end  stage of her labor and was started with ampicillin and gentamicin  prior  to the decel.  The C-section was done by Dr. Shawnie Pons.  Postoperatively,  she was then treated with 4 doses of Cefotetan. The patient was placed  on the postpartum floor and the baby was taken to the NICU.  The patient  continued to do well and is being discharged home on postop day #3.  The  patient will be encouraged to continue her oral hyperglycemic  medications, maintain a healthy ADA diet, as well as to check her blood  sugar daily according to her regimen prior to pregnancy.  The Baby love  nurse will be coming out to remove her sutures on postop day 5-7 as the  patient is overweight.  The patient will follow up with her PCP in the  [facility], Dr. Sylvan Cheese, on Thursday, April 22 at 2:15 p.m.  The patient  was sent home with instructions to have nothing per vagina for 6 weeks,  and to return to [facility] or [facility] if she has any complications with  her surgical site.  She was discharged home in stable medical condition.      Jamie Brookes, MD      Norton Blizzard,  MD  Electronically Signed    AS/MEDQ  D:  03/04/2009  T:  03/05/2009  Job:  [id number]

## 2011-09-01 LAB — URINALYSIS, ROUTINE W REFLEX MICROSCOPIC
Bilirubin Urine: NEGATIVE
Glucose, UA: NEGATIVE
Hgb urine dipstick: NEGATIVE
Ketones, ur: NEGATIVE
Nitrite: NEGATIVE
Protein, ur: NEGATIVE
Specific Gravity, Urine: 1.02
Urobilinogen, UA: 0.2
pH: 7.5

## 2011-09-01 LAB — PROTEIN, URINE, 24 HOUR
Collection Interval-UPROT: 24
Protein, 24H Urine: 95
Protein, Urine: 5
Urine Total Volume-UPROT: 1900

## 2011-09-01 LAB — CBC
HCT: 41.3
Hemoglobin: 13.7
MCHC: 33.2
MCV: 100.3 — ABNORMAL HIGH
Platelets: 330
RDW: 13.9

## 2011-09-01 LAB — POCT URINALYSIS DIP (DEVICE)
Bilirubin Urine: NEGATIVE
Bilirubin Urine: NEGATIVE
Bilirubin Urine: NEGATIVE
Glucose, UA: NEGATIVE
Hgb urine dipstick: NEGATIVE
Ketones, ur: 15 — AB
Ketones, ur: NEGATIVE
Ketones, ur: 40 — AB
Operator id: 287931
Operator id: 297281
Specific Gravity, Urine: 1.02
Specific Gravity, Urine: 1.025
pH: 6
pH: 7

## 2011-09-01 LAB — URINE MICROSCOPIC-ADD ON

## 2011-09-01 LAB — CREATININE, URINE, 24 HOUR
Collection Interval-UCRE24: 24
Urine Total Volume-UCRE24: 1900

## 2011-09-01 LAB — COMPREHENSIVE METABOLIC PANEL
Albumin: 3.6
Alkaline Phosphatase: 67
BUN: 4 — ABNORMAL LOW
Calcium: 8.8
Creatinine, Ser: 0.48
Glucose, Bld: 144 — ABNORMAL HIGH
Total Protein: 6.7

## 2011-09-01 LAB — GLUCOSE, CAPILLARY
Glucose-Capillary: 113 — ABNORMAL HIGH
Glucose-Capillary: 119 — ABNORMAL HIGH
Glucose-Capillary: 125 — ABNORMAL HIGH
Glucose-Capillary: 135 — ABNORMAL HIGH
Glucose-Capillary: 144 — ABNORMAL HIGH
Glucose-Capillary: 149 — ABNORMAL HIGH
Glucose-Capillary: 159 — ABNORMAL HIGH
Glucose-Capillary: 175 — ABNORMAL HIGH
Glucose-Capillary: 194 — ABNORMAL HIGH
Glucose-Capillary: 71
Glucose-Capillary: 72
Glucose-Capillary: 93

## 2011-09-01 LAB — URINE CULTURE: Colony Count: 8000

## 2011-09-01 LAB — WET PREP, GENITAL
Clue Cells Wet Prep HPF POC: NONE SEEN
Trich, Wet Prep: NONE SEEN
WBC, Wet Prep HPF POC: NONE SEEN
Yeast Wet Prep HPF POC: NONE SEEN

## 2011-09-01 LAB — RPR: RPR Ser Ql: NONREACTIVE

## 2011-09-01 LAB — GC/CHLAMYDIA PROBE AMP, GENITAL
Chlamydia, DNA Probe: NEGATIVE
GC Probe Amp, Genital: NEGATIVE

## 2011-09-01 LAB — TSH: TSH: 1.982

## 2011-09-04 LAB — POCT URINALYSIS DIP (DEVICE)
Bilirubin Urine: NEGATIVE
Glucose, UA: NEGATIVE mg/dL
Ketones, ur: NEGATIVE mg/dL
Protein, ur: 30 mg/dL — AB
Specific Gravity, Urine: 1.02 (ref 1.005–1.030)

## 2011-09-15 LAB — BASIC METABOLIC PANEL
CO2: 24
Chloride: 103
GFR calc Af Amer: >60
Potassium: 3.7
Sodium: 135

## 2012-02-26 ENCOUNTER — Encounter (HOSPITAL_COMMUNITY): Payer: Self-pay | Admitting: *Deleted

## 2012-02-26 ENCOUNTER — Emergency Department (HOSPITAL_COMMUNITY)
Admission: EM | Admit: 2012-02-26 | Discharge: 2012-02-26 | Disposition: A | Discharge status: Home / Self Care | Payer: Self-pay | Attending: Emergency Medicine | Admitting: Emergency Medicine

## 2012-02-26 DIAGNOSIS — N898 Other specified noninflammatory disorders of vagina: Secondary | ICD-10-CM

## 2012-02-26 DIAGNOSIS — A599 Trichomoniasis, unspecified: Secondary | ICD-10-CM | POA: Insufficient documentation

## 2012-02-26 DIAGNOSIS — R109 Unspecified abdominal pain: Secondary | ICD-10-CM | POA: Insufficient documentation

## 2012-02-26 LAB — URINALYSIS, ROUTINE W REFLEX MICROSCOPIC
Glucose, UA: NEGATIVE mg/dL
Hgb urine dipstick: NEGATIVE
Specific Gravity, Urine: 1.021 (ref 1.005–1.030)
pH: 6 (ref 5.0–8.0)

## 2012-02-26 LAB — URINE MICROSCOPIC-ADD ON

## 2012-02-26 LAB — POCT PREGNANCY, URINE: Preg Test, Ur: NEGATIVE

## 2012-02-26 MED ORDER — LIDOCAINE HCL (PF) 1 % IJ SOLN
INTRAMUSCULAR | Status: AC
  Administered 2012-02-26: 0.9 mL
  Filled 2012-02-26: qty 5

## 2012-02-26 MED ORDER — AZITHROMYCIN 250 MG PO TABS
1000.0000 mg | ORAL_TABLET | Freq: Once | ORAL | Status: AC
  Administered 2012-02-26: 1000 mg via ORAL
  Filled 2012-02-26: qty 4

## 2012-02-26 MED ORDER — CEFTRIAXONE SODIUM 250 MG IJ SOLR
250.0000 mg | Freq: Once | INTRAMUSCULAR | Status: AC
  Administered 2012-02-26: 250 mg via INTRAMUSCULAR
  Filled 2012-02-26: qty 250

## 2012-02-26 MED ORDER — METRONIDAZOLE 500 MG PO TABS: 500.0000 mg | ORAL_TABLET | Freq: Two times a day (BID) | ORAL | Status: AC

## 2012-02-26 MED ORDER — IBUPROFEN 600 MG PO TABS: 600.0000 mg | ORAL_TABLET | Freq: Four times a day (QID) | ORAL | Status: AC | PRN

## 2012-02-26 MED ORDER — ONDANSETRON HCL 8 MG PO TABS
4.0000 mg | ORAL_TABLET | Freq: Once | ORAL | Status: AC
  Administered 2012-02-26: 4 mg via ORAL
  Filled 2012-02-26: qty 1

## 2012-02-26 NOTE — Discharge Instructions (Signed)
Trichomoniasis Trichomoniasis is an infection, caused by the Trichomonas organism, that affects both women and men. In women, the outer female genitalia and the vagina are affected. In men, the penis is mainly affected, but the prostate and other reproductive organs can also be involved. Trichomoniasis is a sexually transmitted disease (STD) and is most often passed to another person through sexual contact. The majority of people who get trichomoniasis do so from a sexual encounter and are also at risk for other STDs. CAUSES   Sexual intercourse with an infected partner.   It can be present in swimming pools or hot tubs.  SYMPTOMS   Abnormal gray-green frothy vaginal discharge in women.   Vaginal itching and irritation in women.   Itching and irritation of the area outside the vagina in women.   Penile discharge with or without pain in males.   Inflammation of the urethra (urethritis), causing painful urination.   Bleeding after sexual intercourse.  RELATED COMPLICATIONS  Pelvic inflammatory disease.   Infection of the uterus (endometritis).   Infertility.   Tubal (ectopic) pregnancy.   It can be associated with other STDs, including gonorrhea and chlamydia, hepatitis B, and HIV.  COMPLICATIONS DURING PREGNANCY  Early (premature) delivery.   Premature rupture of the membranes (PROM).   Low birth weight.  DIAGNOSIS   Visualization of Trichomonas under the microscope from the vagina discharge.   Ph of the vagina greater than 4.5, tested with a test tape.   Trich Rapid Test.   Culture of the organism, but this is not usually needed.   It may be found on a Pap test.   Having a "strawberry cervix,"which means the cervix looks very red like a strawberry.  TREATMENT   You may be given medication to fight the infection. Inform your caregiver if you could be or are pregnant. Some medications used to treat the infection should not be taken during pregnancy.    Over-the-counter medications or creams to decrease itching or irritation may be recommended.   Your sexual partner will need to be treated if infected.  HOME CARE INSTRUCTIONS   Take all medication prescribed by your caregiver.   Take over-the-counter medication for itching or irritation as directed by your caregiver.   Do not have sexual intercourse while you have the infection.   Do not douche or wear tampons.   Discuss your infection with your partner, as your partner may have acquired the infection from you. Or, your partner may have been the person who transmitted the infection to you.   Have your sex partner examined and treated if necessary.   Practice safe, informed, and protected sex.   See your caregiver for other STD testing.  SEEK MEDICAL CARE IF:   You still have symptoms after you finish the medication.   You have an oral temperature above 102 F (38.9 C).   You develop belly (abdominal) pain.   You have pain when you urinate.   You have bleeding after sexual intercourse.   You develop a rash.   The medication makes you sick or makes you throw up (vomit).  Document Released: 05/12/2001 Document Revised: 11/05/2011 Document Reviewed: 06/07/2009 [facility] Patient Information 2012 [place], [place].  RESOURCE GUIDE  Dental Problems  Patients with Medicaid: [facility] [phone] [address]  [address] Phone:  [phone]                                                   Phone:  [phone]  If unable to pay or uninsured, contact:  [facility] or [facility]. to become qualified for the adult dental clinic.  Chronic Pain Problems Contact [facility]  [phone] Patients need to be referred by their primary care doctor.  Insufficient Money for Medicine Contact United Way:  call "211" or [facility] [phone].  No  Primary Care Doctor Call [facility]  [phone] Other agencies that provide inexpensive medical care    [facility]  [phone]    [facility]  [phone]    [facility]  [phone]    [facility]  [phone]    [facility]  [phone]    [facility]  [phone]  Psychological Services [facility]  [phone] [facility]  [phone] [facility]   [phone] (emergency services [phone])  Abuse/Neglect [facility] Child Abuse Hotline [phone] [facility] Child Abuse Hotline [phone] (After Hours)  Emergency Shelter [facility] [phone]  Maternity Homes Room at the [place] [facility] [phone] [place] [facility] Services [phone]  MRSA Hotline #:   626-022-0345    [facility] Resources  [facility]  United Way                           [facility] Dept. [address] [place]                     [address]         [address]  Blondell Reveal Phone:  [phone]                                  Phone:  [phone]                   Phone:  [phone]  [facility] Mental Health Phone:  [phone]  [facility] Child Abuse Hotline [phone] [phone] (After Hours)  Contraceptive Barrier Methods A barrier method is a type of birth control (contraception) that is used to prevent pregnancy. These methods include:  Female condom.   Female condom.   Diaphragm.   Cervical cap.   Sponge.   Spermicide.  Your caregiver can help you decide what form of contraception is best for you. Always keep in mind the risks of sexually transmitted diseases (STDs).  FEMALE CONDOM A female condom is a thin sheath (latex or rubber) that is worn over the penis during sexual intercourse. The condom prevents  pregnancy by catching and stopping the sperm from reaching the uterus. They may come with a spermicide on them. Condoms should not be used with petroleum jelly, lotions, or oils. This decreases their effectiveness. Condoms can be used with water-based lubricants. Condoms help protect against STDs.  FEMALE CONDOM The female condom is a soft, loose-fitting sheath that is put into the vagina before sexual intercourse. It prevents pregnancy by catching the sperm in the condom and blocks the passage of sperm to the uterus. It is intended for one-time use only. A female partner should not use a condom at the same time. The female and female condoms may stick together and break. A female condom can be inserted as long as 8 hours before intercourse. Condoms help protect against STDs. DIAPHRAGM A diaphragmis a soft, latex, dome-shaped barrier that is placed in the vagina with spermicidal jelly before sexual intercourse. It covers the cervix, kills sperm, and blocks the passage of semen into the cervix. The diaphragm should be left in the vagina for 6 to 8 hours after intercourse. It must be fitted by a caregiver. This method does not protect against STDs. CERVICAL CAP A cervical cap is a round, soft, latex or plastic cup that is put in the vagina and fits over the cervix. It is inserted as long as 8 hours before sexual activity and can be left in place for as long as 48 hours. It provides continuous protection as long as it is in place, regardless of the number of intercourse acts. It must be fitted by a caregiver. Cervical caps do not protect against STDs. SPONGE A sponge is a soft, circular piece of polyurethane foam that has spermicide in it. The sponge has a loop for removal. It is inserted into the vagina after wetting it and is placed over the cervix before sexual intercourse. The foam is designed to trap and absorb sperm before it enters the cervix. The spermicide kills or immobilizes sperm. It offers an  immediate and continuous presence of spermicide throughout a 24-hour period regardless of the number of intercourse acts. The sponge should be left in place for at least 6 hours after sex. It should not be left in for more than 24 hours. The sponge does not protect against STDs. SPERMICIDES Spermicides are chemicals that kill or block sperm from entering the cervix and uterus. They come in the form of creams, jellies, suppositories, foam, film, or tablets. The film, tablets, and suppositories should be inserted 10 to 30 minutes before sexual intercourse so they can dissolve. They are inserted into the vagina with an applicator before having sexual intercourse. This must be repeated every time you have sexual intercourse. The use of spermicides does not protect against STDs. Document Released: 09/13/2007 Document Revised: 11/05/2011 Document Reviewed: 04/01/2011 [facility] Patient Information 2012 [place], [place].

## 2012-02-26 NOTE — ED Notes (Signed)
Pt reports 2 day hx of lower abdominal pain associated with vaginal discharge, denies n/v/d.

## 2012-02-26 NOTE — ED Provider Notes (Addendum)
History     CSN: [id number]  Arrival date & time 02/26/12  1039   First MD Initiated Contact with Patient 02/26/12 1231      Chief Complaint  Patient presents with  . Vaginal Discharge  . Abdominal Pain    (Consider location/radiation/quality/duration/timing/severity/associated sxs/prior treatment) HPI  pw diffuse lower abdominal cramping and vaginal discharge x 2 days. Rates cramping 4/10. She has not take anything prior to arrival for relief. She complains of foul-smelling vaginal discharge. She is sexually active. She has a history of Trichomonas which was treated in the past. She denies fevers, chills, back pain. Denies hematuria/dysuria/freq/urgency. No constipation or diarrhea.   ED Notes, ED Provider Notes from 02/26/12 0000 to 02/26/12 10:59:01       Melissa Loyal Gambler, RN 02/26/2012 10:57      Pt reports 2 day hx of lower abdominal pain associated with vaginal discharge, denies n/v/d.      Past Medical History  Diagnosis Date  . Diabetes mellitus     History reviewed. No pertinent past surgical history.  History reviewed. No pertinent family history.  History  Substance Use Topics  . Smoking status: Current Everyday Smoker  . Smokeless tobacco: Not on file  . Alcohol Use: No    OB History    Grav Para Term Preterm Abortions TAB SAB Ect Mult Living                  Review of Systems  All other systems reviewed and are negative.   except as noted HPI   Allergies  Review of patient's allergies indicates no known allergies.  Home Medications   Current Outpatient Rx  Name Route Sig Dispense Refill  . IBUPROFEN 200 MG PO TABS Oral Take 200-400 mg by mouth every 6 (six) hours as needed. Pain.    . IBUPROFEN 600 MG PO TABS Oral Take 1 tablet (600 mg total) by mouth every 6 (six) hours as needed for pain. 30 tablet 0  . METRONIDAZOLE 500 MG PO TABS Oral Take 1 tablet (500 mg total) by mouth 2 (two) times daily. 14 tablet 0    BP 125/82  Pulse  98  Temp 98.2 F (36.8 C)  Resp 20  SpO2 97%  Physical Exam  Nursing note and vitals reviewed. Constitutional: She is oriented to person, place, and time. She appears well-developed.  HENT:  Head: Atraumatic.  Mouth/Throat: Oropharynx is clear and moist.  Eyes: Conjunctivae and EOM are normal. Pupils are equal, round, and reactive to light.  Neck: Normal range of motion. Neck supple.  Cardiovascular: Normal rate, regular rhythm, normal heart sounds and intact distal pulses.   Pulmonary/Chest: Effort normal and breath sounds normal. No respiratory distress. She has no wheezes. She has no rales.  Abdominal: Soft. She exhibits no distension. There is tenderness. There is no rebound and no guarding.       Min diffuse lower abd ttp  Genitourinary:       +vaginal discharge Cervix nl appearing No CMT No r/l adnexal ttp  Musculoskeletal: Normal range of motion.  Neurological: She is alert and oriented to person, place, and time.  Skin: Skin is warm and dry. No rash noted.  Psychiatric: She has a normal mood and affect.    ED Course  Procedures (including critical care time)  Labs Reviewed  URINALYSIS, ROUTINE W REFLEX MICROSCOPIC - Abnormal; Notable for the following:    Leukocytes, UA SMALL (*)    All other components within normal limits  URINE MICROSCOPIC-ADD ON - Abnormal; Notable for the following:    Squamous Epithelial / LPF FEW (*)    All other components within normal limits  WET PREP, GENITAL - Abnormal; Notable for the following:    Trich, Wet Prep FEW (*)    Clue Cells Wet Prep HPF POC FEW (*)    WBC, Wet Prep HPF POC MANY (*)    All other components within normal limits  POCT PREGNANCY, URINE  GC/CHLAMYDIA PROBE AMP, GENITAL   No results found.   1. Vaginal Discharge   2. Abdominal cramping   3. Trichomonas       MDM  Pt treated with azithromycin and ceftriaxone in ED to cover gc/chl (pending). I do not suspect PID or TOA at this time. I do not suspect  appendicitis. Trichomonas on wet prep and clue cells. Will cover with flagyl. Advised to abstain from sexual relations or use barrier protection.        Forbes Cellar, MD 02/26/12 1514  Forbes Cellar, MD 02/26/12 1515  Forbes Cellar, MD 02/26/12 1520

## 2012-02-29 LAB — GC/CHLAMYDIA PROBE AMP, GENITAL: Chlamydia, DNA Probe: NEGATIVE

## 2012-03-01 NOTE — ED Notes (Addendum)
+   GC Patient treated with Rocephin and Zithromax-DHHS letter faxed- 

## 2012-11-06 ENCOUNTER — Encounter (HOSPITAL_COMMUNITY): Payer: Self-pay

## 2012-11-06 ENCOUNTER — Emergency Department (HOSPITAL_COMMUNITY)
Admission: EM | Admit: 2012-11-06 | Discharge: 2012-11-06 | Disposition: A | Discharge status: Home / Self Care | Payer: Self-pay | Attending: Emergency Medicine | Admitting: Emergency Medicine

## 2012-11-06 DIAGNOSIS — Z87891 Personal history of nicotine dependence: Secondary | ICD-10-CM | POA: Insufficient documentation

## 2012-11-06 DIAGNOSIS — B86 Scabies: Secondary | ICD-10-CM | POA: Insufficient documentation

## 2012-11-06 DIAGNOSIS — I10 Essential (primary) hypertension: Secondary | ICD-10-CM | POA: Insufficient documentation

## 2012-11-06 DIAGNOSIS — E119 Type 2 diabetes mellitus without complications: Secondary | ICD-10-CM | POA: Insufficient documentation

## 2012-11-06 HISTORY — DX: Essential (primary) hypertension: I10

## 2012-11-06 MED ORDER — PERMETHRIN 5 % EX CREA: TOPICAL_CREAM | CUTANEOUS | Status: DC

## 2012-11-06 NOTE — ED Provider Notes (Signed)
History     CSN: [id number]  Arrival date & time 11/06/12  1124   First MD Initiated Contact with Patient 11/06/12 1226      Chief Complaint  Patient presents with  . Rash    (Consider location/radiation/quality/duration/timing/severity/associated sxs/prior treatment) Patient is a 26 y.o. female presenting with rash. The history is provided by the patient. No language interpreter was used.  Rash  This is a recurrent problem. The current episode started more than 1 week ago. The problem has been gradually worsening. The problem is associated with an unknown factor. There has been no fever. The rash is present on the torso, right arm, left fingers, left upper leg, left lower leg, left arm, left hand, right hand, right fingers, right lower leg and right upper leg. The patient is experiencing no pain. Associated symptoms include itching. She has tried anti-itch cream for the symptoms.   26 year old female coming in with a red raised fine rash to her torso arms legs and hands linear appearance. States that it itches x1 month. Her roommate is here with the same complaint. Patient has been using unknown creams with no relief.  Past Medical History  Diagnosis Date  . Diabetes mellitus   . Hypertension     Past Surgical History  Procedure Date  . Cesarean section     History reviewed. No pertinent family history.  History  Substance Use Topics  . Smoking status: Former Smoker    Quit date: 10/30/2012  . Smokeless tobacco: Not on file  . Alcohol Use: Yes     Comment: occasionally    OB History    Grav Para Term Preterm Abortions TAB SAB Ect Mult Living                  Review of Systems  Constitutional: Negative.  Negative for fever.  HENT: Negative.  Negative for ear pain, neck pain and neck stiffness.   Eyes: Negative.   Respiratory: Negative.  Negative for shortness of breath.   Cardiovascular: Negative.   Gastrointestinal: Negative.   Skin: Positive for itching and  rash.  Neurological: Negative.   Psychiatric/Behavioral: Negative.   All other systems reviewed and are negative.    Allergies  Review of patient's allergies indicates no known allergies.  Home Medications  No current outpatient prescriptions on file.  BP 154/102  Pulse 89  Temp 97.8 F (36.6 C) (Oral)  Resp 16  Ht 5\' 5"  (1.651 m)  Wt 190 lb (86.183 kg)  BMI 31.62 kg/m2  SpO2 100%  LMP 10/24/2012  Physical Exam  Nursing note and vitals reviewed. Constitutional: She is oriented to person, place, and time. She appears well-developed and well-nourished.  HENT:  Head: Normocephalic and atraumatic.  Eyes: Conjunctivae normal and EOM are normal. Pupils are equal, round, and reactive to light.  Neck: Normal range of motion. Neck supple.  Cardiovascular: Normal rate and regular rhythm.   Pulmonary/Chest: Effort normal and breath sounds normal.  Abdominal: Soft. Bowel sounds are normal.  Musculoskeletal: Normal range of motion. She exhibits no edema and no tenderness.  Neurological: She is alert and oriented to person, place, and time. She has normal reflexes.  Skin: Skin is warm and dry. Rash noted.       Fine red raised rash linear similar to scabies.  Psychiatric: She has a normal mood and affect.    ED Course  Procedures (including critical care time)  Labs Reviewed - No data to display No results found.  1. Scabies       MDM  Scabies. Patient and roommate both here for same rash x 1 month.   rx for permethrin        Remi Haggard, NP 11/07/12 1259

## 2012-11-06 NOTE — ED Notes (Addendum)
Pt sts rash and itching x 2 months.  Sts it started on hands and has spread to entire body.  Sts she is constantly changing lotions and detergents.  Denies pain/swelling.  Denies SOB.  Small bumps noted.

## 2012-11-07 NOTE — ED Provider Notes (Signed)
Medical screening examination/treatment/procedure(s) were performed by non-physician practitioner and as supervising physician I was immediately available for consultation/collaboration.    Albion Weatherholtz L Abbygail Willhoite, MD 11/07/12 2245 

## 2013-05-09 ENCOUNTER — Encounter (HOSPITAL_COMMUNITY): Payer: Self-pay | Admitting: *Deleted

## 2013-05-09 ENCOUNTER — Inpatient Hospital Stay (HOSPITAL_COMMUNITY)
Admission: AD | Admit: 2013-05-09 | Discharge: 2013-05-09 | Disposition: A | Discharge status: Home / Self Care | Payer: Medicaid Other | Source: Ambulatory Visit | Attending: Obstetrics & Gynecology | Admitting: Obstetrics & Gynecology

## 2013-05-09 DIAGNOSIS — O24919 Unspecified diabetes mellitus in pregnancy, unspecified trimester: Secondary | ICD-10-CM | POA: Insufficient documentation

## 2013-05-09 DIAGNOSIS — R109 Unspecified abdominal pain: Secondary | ICD-10-CM | POA: Insufficient documentation

## 2013-05-09 DIAGNOSIS — E119 Type 2 diabetes mellitus without complications: Secondary | ICD-10-CM | POA: Insufficient documentation

## 2013-05-09 DIAGNOSIS — O24912 Unspecified diabetes mellitus in pregnancy, second trimester: Secondary | ICD-10-CM

## 2013-05-09 DIAGNOSIS — O99891 Other specified diseases and conditions complicating pregnancy: Secondary | ICD-10-CM | POA: Insufficient documentation

## 2013-05-09 DIAGNOSIS — N949 Unspecified condition associated with female genital organs and menstrual cycle: Secondary | ICD-10-CM

## 2013-05-09 LAB — URINALYSIS, ROUTINE W REFLEX MICROSCOPIC
Glucose, UA: 250 mg/dL — AB
Ketones, ur: NEGATIVE mg/dL
Leukocytes, UA: NEGATIVE
Nitrite: NEGATIVE
Protein, ur: NEGATIVE mg/dL
pH: 6 (ref 5.0–8.0)

## 2013-05-09 NOTE — MAU Provider Note (Signed)
History     CSN: [id number]  Arrival date and time: 05/09/13 1230   First Provider Initiated Contact with Patient 05/09/13 1313      Chief Complaint  Patient presents with  . Possible Pregnancy  . Abdominal Pain   HPI Ms. Vickie Gonzales is a 27 y.o. G2P1001 at [redacted]w[redacted]d who presents to MAU today with complaint abdominal pain. She states that the abdominal pain only occurs when she is bending and lifting. She denies pain now. The patient states that she is doing mandatory community service at [facility] and has to do some lifting there. She requests a note to be taken out of community service. She denies N/V/D or constipation. She denies fever, vaginal bleeding or vaginal discharge. Patient states that she was a [facility] patient, but has lost her insurance. She was referred to [facility] at [facility] for last pregnancy because of DM.    OB History   Grav Para Term Preterm Abortions TAB SAB Ect Mult Living   2 1 1       1       Past Medical History  Diagnosis Date  . Diabetes mellitus   . Hypertension     Past Surgical History  Procedure Laterality Date  . Cesarean section      History reviewed. No pertinent family history.  History  Substance Use Topics  . Smoking status: Current Every Day Smoker -- 0.25 packs/day  . Smokeless tobacco: Never Used  . Alcohol Use: No     Comment: occasionally    Allergies: No Known Allergies  No prescriptions prior to admission    Review of Systems  Constitutional: Negative for fever and weight loss.  Gastrointestinal: Positive for abdominal pain. Negative for nausea, vomiting, diarrhea and constipation.  Genitourinary: Negative for dysuria, urgency and frequency.       Neg - Vaginal discharge, bleeding   Physical Exam   Blood pressure 117/66, pulse 106, temperature 98.3 F (36.8 C), temperature source Oral, resp. rate 16, height 5\' 5"  (1.651 m), weight 190 lb 6.4 oz (86.365 kg), last menstrual period 01/26/2013, SpO2 99.00%.  Physical Exam   Constitutional: She is oriented to person, place, and time. She appears well-developed and well-nourished. No distress.  HENT:  Head: Normocephalic and atraumatic.  Cardiovascular: Normal rate, regular rhythm and normal heart sounds.   Respiratory: Effort normal and breath sounds normal. No respiratory distress.  GI: Soft. Bowel sounds are normal. She exhibits no distension and no mass. There is no tenderness. There is no rebound and no guarding.  Neurological: She is alert and oriented to person, place, and time.  Skin: Skin is warm and dry. No erythema.  Psychiatric: She has a normal mood and affect.   Results for orders placed during the hospital encounter of 05/09/13 (from the past 24 hour(s))  URINALYSIS, ROUTINE W REFLEX MICROSCOPIC     Status: Abnormal   Collection Time    05/09/13 12:45 PM      Result Value Range   Color, Urine YELLOW  YELLOW   APPearance CLEAR  CLEAR   Specific Gravity, Urine >1.030 (*) 1.005 - 1.030   pH 6.0  5.0 - 8.0   Glucose, UA 250 (*) NEGATIVE mg/dL   Hgb urine dipstick NEGATIVE  NEGATIVE   Bilirubin Urine NEGATIVE  NEGATIVE   Ketones, ur NEGATIVE  NEGATIVE mg/dL   Protein, ur NEGATIVE  NEGATIVE mg/dL   Urobilinogen, UA 0.2  0.0 - 1.0 mg/dL   Nitrite NEGATIVE  NEGATIVE  Leukocytes, UA NEGATIVE  NEGATIVE    MAU Course  Procedures None  MDM +FHTs  Assessment and Plan  A: Round ligament pain  P: Discharge home Work restrictions letter given  Pregnancy confirmation letter given Referred to [facility] clinic at [facility] for DM. They will call her with an appointment Patient may return to MAU as needed  Freddi Starr, PA-C  05/09/2013, 1:24 PM

## 2013-05-09 NOTE — MAU Note (Signed)
Patient states she had a positive home pregnancy test about 3 weeks ago. Has been having lower abdominal pain for about 3 weeks. Having trouble lifting any weight. Denies bleeding or discharge. Patient is doing mandatory community service at [facility] and needs a note to not have to continue this service.

## 2013-05-09 NOTE — MAU Provider Note (Signed)
Attestation of Attending Supervision of Advanced Practitioner (PA/CNM/NP): Evaluation and management procedures were performed by the Advanced Practitioner under my supervision and collaboration.  I have reviewed the Advanced Practitioner's note and chart, and I agree with the management and plan.  Theola Cuellar, MD, FACOG Attending Obstetrician & Gynecologist Faculty Practice, [facility]  

## 2013-05-22 ENCOUNTER — Encounter: Payer: Self-pay | Admitting: Family Medicine

## 2013-06-05 ENCOUNTER — Encounter: Payer: Self-pay | Admitting: Family Medicine

## 2013-06-05 ENCOUNTER — Other Ambulatory Visit (HOSPITAL_COMMUNITY)
Admission: RE | Admit: 2013-06-05 | Discharge: 2013-06-05 | Disposition: A | Discharge status: Home / Self Care | Payer: Medicaid Other | Source: Ambulatory Visit | Attending: Family Medicine | Admitting: Family Medicine

## 2013-06-05 ENCOUNTER — Telehealth: Payer: Self-pay | Admitting: *Deleted

## 2013-06-05 ENCOUNTER — Ambulatory Visit (INDEPENDENT_AMBULATORY_CARE_PROVIDER_SITE_OTHER): Payer: Medicaid Other | Admitting: Family Medicine

## 2013-06-05 ENCOUNTER — Encounter: Payer: Medicaid Other | Attending: Family Medicine | Admitting: *Deleted

## 2013-06-05 VITALS — BP 116/79 | Temp 97.6°F | Wt 193.6 lb

## 2013-06-05 DIAGNOSIS — O0992 Supervision of high risk pregnancy, unspecified, second trimester: Secondary | ICD-10-CM | POA: Insufficient documentation

## 2013-06-05 DIAGNOSIS — Z113 Encounter for screening for infections with a predominantly sexual mode of transmission: Secondary | ICD-10-CM | POA: Insufficient documentation

## 2013-06-05 DIAGNOSIS — Z01419 Encounter for gynecological examination (general) (routine) without abnormal findings: Secondary | ICD-10-CM | POA: Insufficient documentation

## 2013-06-05 DIAGNOSIS — O10019 Pre-existing essential hypertension complicating pregnancy, unspecified trimester: Secondary | ICD-10-CM

## 2013-06-05 DIAGNOSIS — E119 Type 2 diabetes mellitus without complications: Secondary | ICD-10-CM | POA: Insufficient documentation

## 2013-06-05 DIAGNOSIS — O24312 Unspecified pre-existing diabetes mellitus in pregnancy, second trimester: Secondary | ICD-10-CM

## 2013-06-05 DIAGNOSIS — O24919 Unspecified diabetes mellitus in pregnancy, unspecified trimester: Secondary | ICD-10-CM | POA: Insufficient documentation

## 2013-06-05 DIAGNOSIS — O10912 Unspecified pre-existing hypertension complicating pregnancy, second trimester: Secondary | ICD-10-CM

## 2013-06-05 DIAGNOSIS — O10919 Unspecified pre-existing hypertension complicating pregnancy, unspecified trimester: Secondary | ICD-10-CM | POA: Insufficient documentation

## 2013-06-05 DIAGNOSIS — O34219 Maternal care for unspecified type scar from previous cesarean delivery: Secondary | ICD-10-CM

## 2013-06-05 LAB — POCT URINALYSIS DIP (DEVICE)
Glucose, UA: NEGATIVE mg/dL
Nitrite: NEGATIVE
Protein, ur: 30 mg/dL — AB
Specific Gravity, Urine: >=1.03 (ref 1.005–1.030)
Urobilinogen, UA: 0.2 mg/dL (ref 0.0–1.0)

## 2013-06-05 LAB — HIV ANTIBODY (ROUTINE TESTING W REFLEX): HIV: NONREACTIVE

## 2013-06-05 LAB — HEMOGLOBIN A1C: Hgb A1c MFr Bld: 6.1 % — ABNORMAL HIGH (ref <5.7)

## 2013-06-05 MED ORDER — PRENATAL VIT-FE FUMARATE-FA 29-1 MG PO CHEW: 1.0000 | CHEWABLE_TABLET | Freq: Every day | ORAL | Status: DC

## 2013-06-05 MED ORDER — GLUCOSE BLOOD VI STRP: ORAL_STRIP | Status: DC

## 2013-06-05 MED ORDER — ACCU-CHEK NANO SMARTVIEW W/DEVICE KIT: 1.0000 | PACK | Status: DC

## 2013-06-05 MED ORDER — FAMOTIDINE 20 MG PO TABS: 20.0000 mg | ORAL_TABLET | Freq: Two times a day (BID) | ORAL | Status: DC

## 2013-06-05 MED ORDER — ACCU-CHEK FASTCLIX LANCETS MISC: 1.0000 | Freq: Four times a day (QID) | Status: DC

## 2013-06-05 NOTE — Progress Notes (Signed)
Subjective:    Vickie Gonzales is being seen today for her first obstetrical visit.  This is not a planned pregnancy. She is at [redacted]w[redacted]d gestation by LMP (01/26/13), no ultrasounds yet. LMP is certain - regular periods, tracking them. Her obstetrical history is significant for intrauterine growth restriction (IUGR), non-compliance, smoker and previous cesarean section, Class C Diabetes. Relationship with FOB: significant other, not living together, different father from first baby. Patient does intend to breast feed. Pregnancy history fully reviewed.  First pregnancy, TAB.  2nd pregnancy induced at 36 weeks due to IUGR after amniocentesis to confirm fetal lung maturity - c-section for non-reassuring fetal heart tones. Diabetes since her teens (age 27 or 47). On metformin initially, insulin during last pregnancy then back on metformin. However, off all medications and no follow-up for the past 2 years due to lack of insurance.   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  No other abnormal pap smears but no follow up since 2009. History of chlamydia in 2012 and 2013.  Smokes about 4 cigarettes a day. Denies alcohol or drug use.  Spotting 2 weeks ago one time only, no other bleeding. Denies contractions/cramping/abdominal pain. No nausea or vomiting, but does have reflux/heartburn and alternating diarrhea/constipation. No dysuria or vaginal discharge. Is feeling fetal movement.  Menstrual History: OB History   Grav Para Term Preterm Abortions TAB SAB Ect Mult Living   3 1 1  1 1    1        The following portions of the patient's history were reviewed and updated as appropriate: allergies, current medications, past family history, past medical history, past social history, past surgical history and problem list.  Review of Systems:  See HPI.   Objective:   Filed Vitals:   06/05/13 1042  BP: 116/79  Temp: 97.6 F (36.4 C)   GEN:  WNWD, no distress HEENT:  NCAT, EOMI, conjunctiva clear NECK:   Supple, non-tender, no thyromegaly, trachea midline CV: RRR, no murmur RESP:  CTAB ABD:  Soft, non-tender, no guarding or rebound, normal bowel sounds EXTREM:  Warm, well perfused, no edema or tenderness NEURO:  Alert, oriented, no focal deficits GU:  Normal external genitalia, normal vagina, moderate amount of white discharge. Normal cervix, no CMT or adnexal tenderness.        Assessment/Plan    Pregnancy at 18 and 4/7 weeks   Initial labs drawn.  Prenatal vitamins.  Problem list reviewed and updated.  AFP3 discussed: undecided.  Role of ultrasound in pregnancy discussed; fetal survey: ordered  Amniocentesis discussed: not indicated. Class C Diabetes - not currently on medications.    A1C today  [place] CMP and 24-hour urine protein  start QID blood sugar checks  meet with diabetic educator and nutritionist today  fetal echo and ophthalmology referrals Prior cesarean section for NRFHTs  Desires TOLAC - need to sign consent Pepcid for reflux Follow up in 1 week with diabetic educator and in 2 weeks with physician 50% of 45 min visit spent on counseling and coordination of care.

## 2013-06-05 NOTE — Progress Notes (Signed)
Nutrition note: 1st visit consult Pt has h/o obesity & Type 2 DM. Pt has gained 3.6# @ [redacted]w[redacted]d, which is wnl so far. Pt reports eating 2-3 x/d. Pt is not taking a PNV yet. Pt reports having N/V and heartburn occ. Pt received verbal & written education on general nutrition during pregnancy & GDM diet. Encouraged PNV or 2 chewable multivitamins daily. Discussed tips to decrease N/V and heartburn. Reviewed DM diet. Discussed wt gain goals of 11-20# or 0.5#/wk. Pt agrees to start taking a PNV or equivilent. Pt also agrees to follow GDM diet with 3 meals & 3 snacks/d with proper CHO/ protein combination.   Pt does not have WIC but plans to apply. Pt plans to BF. F/u in 4-6 wks Blondell Reveal, MS, RD, LDN

## 2013-06-05 NOTE — Progress Notes (Signed)
Called and scheduled Eye exam at [facility] Dr. Karleen Hampshire 06/30/13 at 11:15 , also schedule for 2 d echo 06/12/13 at 11am with Dr. Elizebeth Brooking

## 2013-06-05 NOTE — Progress Notes (Signed)
Pt has history of diabetes but has not been on medication thus far.  Tested glucose today on the Accucheck Smartview given to her today and it was 111mg /dL.  However this was taken at 11:30 this morning and pt had not eaten yet.  Pt has monitored glucose  In the past but was new to this type of strip. Expressed  Understanding of how and when to use.  Reviewed diet guidelines, when to check glucose and glucose target goals. Pt expressed understanding as well as teach-back demonstration of meter. Will follow up with glucose readings next visit. Thank you, Lenor Coffin, RN, Diabetes CNS [phone])

## 2013-06-05 NOTE — Telephone Encounter (Signed)
Dr. Thad Ranger prescribed chewable pnv, but printed instead of eprescribe- RN entered again and printed- called pharmacy and medicaid does not cover chewable prenatal vitamins- cost $30 bottle or so. Pharmacist states he thinks he spoke with patient already and told her could get over the counter pnv chewable for a lot less.  Will need to follow up with patient.

## 2013-06-05 NOTE — Progress Notes (Signed)
Pulse- 109  Pain/pressure- ligament pain Weight gain of 25-35lb New ob packet given

## 2013-06-05 NOTE — Patient Instructions (Addendum)
Type 1 or Type 2 Diabetes Mellitus During Pregnancy   Diabetes mellitus, often simply referred to as diabetes, is a long-term (chronic) disease. Type 1 diabetes occurs when the islet cells in the pancreas that make insulin (a hormone) are destroyed and can no longer make insulin. Type 2 diabetes occurs when the pancreas does not make enough insulin, the cells are less responsive to the insulin that is made (insulin resistance), or both. Insulin is needed to move sugars from food into the tissue cells. The tissue cells use the sugars for energy. The lack of insulin or the lack of normal response to insulin causes excess sugars to build up in the blood instead of going into the tissue cells. As a result, high blood sugar (hyperglycemia) develops.   If blood glucose levels are kept in the normal range both before and during pregnancy, women can have a healthy pregnancy. If your blood glucose levels are not well controlled, there may be risks to you, your unborn baby (fetus), your labor and delivery, or your newborn baby.   RISK FACTORS   · You are predisposed to developing type 1 diabetes if someone in your family has diabetes and you are exposed to certain environmental triggers.  · You have an increased chance of developing type 2 diabetes if you have a family history of diabetes and also have one or more of the following risk factors:  · Being overweight.  · Having an inactive lifestyle.  · Having a history of consistently eating high-calorie foods.   SYMPTOMS  The symptoms of diabetes include:  · Increased thirst (polydipsia).  · Increased urination (polyuria).  · Increased urination during the night (nocturia).  · Weight loss. This weight loss may be rapid.  · Frequent, recurring infections.  · Tiredness (fatigue).  · Weakness.  · Vision changes, such as blurred vision.  · Fruity smell to your breath.  · Abdominal pain.  · Nausea or vomiting.  DIAGNOSIS   Diabetes is diagnosed when blood glucose levels are  increased. Your blood glucose level may be checked by one or more of the following blood tests:  · A fasting blood glucose test.  You will not be allowed to eat for at least 8 hours before a blood sample is taken.  · A random blood glucose test. Your blood glucose is checked at any time of the day regardless of when you ate.  · A hemoglobin A1c blood glucose test. A hemoglobin A1c test provides information about blood glucose control over the previous 3 months.  · An oral glucose tolerance test (OGTT). Your blood glucose is measured after you have not eaten (fasted) for 1 3 hours and then after you drink a glucose-containing beverage. An OGTT is usually performed during weeks 24 28 of your pregnancy.  TREATMENT   · You will need to take diabetes medicine or insulin daily to keep blood glucose levels in the desired range.  · You will need to match insulin dosing with exercise and healthy food choices.   The treatment goal is to maintain the before-meal (preprandial), bedtime, and overnight blood glucose level at 60 99 mg/dL during pregnancy. The treatment goal is to further maintain the peak after-meal blood sugar (postprandial glucose) level at 100 140 mg/dL.    HOME CARE INSTRUCTIONS   · Have your hemoglobin A1c level checked twice a year.  · Perform daily blood glucose monitoring as directed by your caregiver. It is common to perform frequent blood glucose monitoring.    · Monitor urine ketones when   you are ill and as directed by your caregiver.  · Take your diabetes medicine and insulin as directed by your caregiver to maintain your blood glucose level in the desired range.  · Never run out of diabetes medicine or insulin. It is needed every day.  · Adjust insulin based on your intake of carbohydrates. Carbohydrates can raise blood glucose levels but need to be included in your diet. Carbohydrates provide vitamins, minerals, and fiber, which are an essential part of a healthy diet. Carbohydrates are found in  fruits, vegetables, whole grains, dairy products, legumes, and foods containing added sugars.  · Eat healthy foods. Alternate 3 meals with 3 snacks.  · Maintain a healthy weight gain. The usual total expected weight gain varies according to your prepregnancy body mass index (BMI).   · Carry a medical alert card or wear medical alert jewelry.  · Carry a 15 gram carbohydrate snack with you at all times to treat low blood sugar (hypoglycemia). Some examples of 15 gram carbohydrate snacks include:  · Glucose tablets, 3 or 4.  · Glucose gel, 15 gram tube.  · Raisins, 2 tablespoons (24 grams).  · Jelly beans, 6.  · Animal crackers, 8.  · Fruit juice, regular soda, or low fat milk, 4 ounces (120 mL).  · Gummy treats, 9.  · Recognize hypoglycemia. Hypoglycemia during pregnancy occurs with blood glucose levels of 60 mg/dL and below. The risk for hypoglycemia increases when fasting or skipping meals, during or after intense exercise, and during sleep. Hypoglycemia symptoms can include:  · Tremors or shakes.  · Decreased ability to concentrate.  · Sweating.  · Increased heart rate.  · Headache.  · Dry mouth.  · Hunger.  · Irritability.  · Anxiety.  · Restless sleep.  · Altered speech or coordination.  · Confusion.  · Treat hypoglycemia promptly. If you are alert and able to safely swallow, follow the 15:15 rule:  · Take 15 20 grams of rapid-acting glucose or carbohydrate. Rapid-acting options include glucose gel, glucose tablets, or 4 ounces (120 mL) of fruit juice, regular soda, or low-fat milk.  · Check your blood glucose level 15 minutes after taking the glucose.  · Take 15 20 grams more of glucose if the repeat blood glucose level is still 70 mg/dL or below.  · Eat a meal or snack within 1 hour once blood glucose levels return to normal.  · Engage in at least 30 minutes of physical activity a day or as directed by your caregiver. Ten minutes of physical activity timed 30 minutes after each meal is encouraged to control  postprandial blood glucose levels.    · Be alert to polyuria and polydipsia, which are early signs of hyperglycemia. An early awareness of hyperglycemia allows for prompt treatment. Treat hyperglycemia as directed by your caregiver.  · Adjust your insulin dosing and food intake as needed if you start a new exercise or sport.  · Follow your sick day plan at any time you are unable to eat or drink as usual.  · Avoid tobacco and alcohol use.  · Follow up with your caregiver regularly.  · Follow the advice of your caregiver regarding your prenatal and post-delivery (postpartum) appointments, meal planning, exercise, medicines, vitamins, blood tests, other medical tests, and physical activities.  · Continue daily skin and foot care. Examine your skin and feet daily for cuts, bruises, redness, nail problems, bleeding, blisters, or sores. A foot exam by a caregiver should be done annually.  · Brush   your teeth and gums at least twice a day and floss at least once a day. Follow up with your dentist regularly.  · Schedule an eye exam during the first trimester of your pregnancy or as directed by your caregiver.  · Share your diabetes management plan with your workplace or school.  · Stay up-to-date with immunizations.  · Learn to manage stress.  · Obtain ongoing diabetes education and support as needed.  SEEK MEDICAL CARE IF:   · You are unable to eat food or drink fluids for more than 6 hours.  · You have nausea and vomiting for more than 6 hours.  · You have a blood glucose level of 200 mg/dL and you have ketones in your urine.  · There is a change in mental status.  · You develop vision problems.  · You have a persistent headache.  · You have upper abdominal pain or discomfort.  · You develop an additional serious illness.  · You have diarrhea for more than 6 hours.  · You have been sick or have had a fever for a couple of days and are not getting better.  SEEK IMMEDIATE MEDICAL CARE IF:  · You have difficulty  breathing.  · You no longer feel the baby moving.  · You are bleeding or have discharge from your vagina.  · You start having premature contractions or labor.  MAKE SURE YOU:  · Understand these instructions.  · Will watch your condition.  · Will get help right away if you are not doing well or get worse.  Document Released: 08/10/2012 Document Revised: 11/02/2012 Document Reviewed: 08/10/2012  ExitCare® Patient Information ©2014 [employer].

## 2013-06-06 LAB — OBSTETRIC PANEL
Basophils Absolute: 0 10*3/uL (ref 0.0–0.1)
Eosinophils Absolute: 0.1 10*3/uL (ref 0.0–0.7)
Eosinophils Relative: 1 % (ref 0–5)
Hepatitis B Surface Ag: NEGATIVE
Lymphs Abs: 2 10*3/uL (ref 0.7–4.0)
MCH: 32.7 pg (ref 26.0–34.0)
MCV: 95.8 fL (ref 78.0–100.0)
Neutrophils Relative %: 76 % (ref 43–77)
Platelets: 315 10*3/uL (ref 150–400)
RBC: 3.55 MIL/uL — ABNORMAL LOW (ref 3.87–5.11)
RDW: 13.9 % (ref 11.5–15.5)
WBC: 12.8 10*3/uL — ABNORMAL HIGH (ref 4.0–10.5)

## 2013-06-07 ENCOUNTER — Ambulatory Visit (HOSPITAL_COMMUNITY)
Admission: RE | Admit: 2013-06-07 | Discharge: 2013-06-07 | Disposition: A | Discharge status: Home / Self Care | Payer: Medicaid Other | Source: Ambulatory Visit | Attending: Family Medicine | Admitting: Family Medicine

## 2013-06-07 DIAGNOSIS — O24919 Unspecified diabetes mellitus in pregnancy, unspecified trimester: Secondary | ICD-10-CM | POA: Insufficient documentation

## 2013-06-07 DIAGNOSIS — Z363 Encounter for antenatal screening for malformations: Secondary | ICD-10-CM | POA: Insufficient documentation

## 2013-06-07 DIAGNOSIS — O34219 Maternal care for unspecified type scar from previous cesarean delivery: Secondary | ICD-10-CM | POA: Insufficient documentation

## 2013-06-07 DIAGNOSIS — Z1389 Encounter for screening for other disorder: Secondary | ICD-10-CM | POA: Insufficient documentation

## 2013-06-07 DIAGNOSIS — O358XX0 Maternal care for other (suspected) fetal abnormality and damage, not applicable or unspecified: Secondary | ICD-10-CM | POA: Insufficient documentation

## 2013-06-07 DIAGNOSIS — Z8751 Personal history of pre-term labor: Secondary | ICD-10-CM | POA: Insufficient documentation

## 2013-06-07 DIAGNOSIS — O99330 Smoking (tobacco) complicating pregnancy, unspecified trimester: Secondary | ICD-10-CM | POA: Insufficient documentation

## 2013-06-07 DIAGNOSIS — E669 Obesity, unspecified: Secondary | ICD-10-CM | POA: Insufficient documentation

## 2013-06-07 DIAGNOSIS — O24312 Unspecified pre-existing diabetes mellitus in pregnancy, second trimester: Secondary | ICD-10-CM

## 2013-06-07 DIAGNOSIS — O99210 Obesity complicating pregnancy, unspecified trimester: Secondary | ICD-10-CM | POA: Insufficient documentation

## 2013-06-07 LAB — HEMOGLOBINOPATHY EVALUATION
Hemoglobin Other: 0 %
Hgb A2 Quant: 3.4 % — ABNORMAL HIGH (ref 2.2–3.2)
Hgb A: 96.1 % — ABNORMAL LOW (ref 96.8–97.8)
Hgb F Quant: 0.5 % (ref 0.0–2.0)
Hgb S Quant: 0 %

## 2013-06-07 NOTE — Telephone Encounter (Signed)
Called patient, no answer- left message stating we are trying to follow up with her and to call us back.

## 2013-06-08 ENCOUNTER — Telehealth: Payer: Self-pay | Admitting: Obstetrics and Gynecology

## 2013-06-08 ENCOUNTER — Encounter: Payer: Self-pay | Admitting: Family Medicine

## 2013-06-08 ENCOUNTER — Other Ambulatory Visit: Payer: Self-pay | Admitting: Family Medicine

## 2013-06-08 DIAGNOSIS — O2342 Unspecified infection of urinary tract in pregnancy, second trimester: Secondary | ICD-10-CM

## 2013-06-08 LAB — CULTURE, OB URINE: Colony Count: >=100000

## 2013-06-08 MED ORDER — AMOXICILLIN 500 MG PO CAPS: 500.0000 mg | ORAL_CAPSULE | Freq: Two times a day (BID) | ORAL | Status: DC

## 2013-06-08 NOTE — Telephone Encounter (Addendum)
Message copied by Toula Moos on Thu Jun 08, 2013  4:45 PM   ------Called patient; no answer, left message to call clinic back.        Message from: FERRY, [place]      Created: Thu Jun 08, 2013  1:41 PM       Pt has UTI with GBS. Amoxicillin prescribed 500 mg BID for 7 days. Please notify pt. Thanks. ------

## 2013-06-08 NOTE — Telephone Encounter (Signed)
Called pt and left message-  Pt has an appt with Ann on 06/12/13 in which we can verify with pt then.

## 2013-06-12 ENCOUNTER — Other Ambulatory Visit: Payer: Medicaid Other

## 2013-06-12 NOTE — Telephone Encounter (Signed)
Patient left a message stating her name and number [phone]

## 2013-06-14 ENCOUNTER — Encounter: Payer: Self-pay | Admitting: General Practice

## 2013-06-14 NOTE — Telephone Encounter (Signed)
Called patient on requested [phone] number and some man answered stating he wasn't at home with the patient right now and will tell her to call us back.

## 2013-06-19 ENCOUNTER — Encounter: Payer: Medicaid Other | Admitting: Obstetrics and Gynecology

## 2013-06-20 NOTE — Telephone Encounter (Signed)
Sent certified letter

## 2013-06-26 ENCOUNTER — Ambulatory Visit (INDEPENDENT_AMBULATORY_CARE_PROVIDER_SITE_OTHER): Payer: Medicaid Other | Admitting: Obstetrics & Gynecology

## 2013-06-26 VITALS — BP 120/85 | Temp 97.1°F | Wt 194.4 lb

## 2013-06-26 DIAGNOSIS — O24919 Unspecified diabetes mellitus in pregnancy, unspecified trimester: Secondary | ICD-10-CM

## 2013-06-26 DIAGNOSIS — O24312 Unspecified pre-existing diabetes mellitus in pregnancy, second trimester: Secondary | ICD-10-CM

## 2013-06-26 LAB — COMPREHENSIVE METABOLIC PANEL
Albumin: 3.6 g/dL (ref 3.5–5.2)
CO2: 22 mEq/L (ref 19–32)
Chloride: 105 mEq/L (ref 96–112)
Creat: 0.5 mg/dL (ref 0.50–1.10)
Sodium: 136 mEq/L (ref 135–145)

## 2013-06-26 LAB — POCT URINALYSIS DIP (DEVICE)
Bilirubin Urine: NEGATIVE
Ketones, ur: NEGATIVE mg/dL
Leukocytes, UA: NEGATIVE
Nitrite: NEGATIVE
Protein, ur: NEGATIVE mg/dL
pH: 6 (ref 5.0–8.0)

## 2013-06-26 MED ORDER — AMOXICILLIN 500 MG PO CAPS: 500.0000 mg | ORAL_CAPSULE | Freq: Two times a day (BID) | ORAL | Status: DC

## 2013-06-26 MED ORDER — PANTOPRAZOLE SODIUM 40 MG PO TBEC: 40.0000 mg | DELAYED_RELEASE_TABLET | Freq: Every day | ORAL | Status: DC

## 2013-06-26 NOTE — Progress Notes (Signed)
No recent CBG result, will do better. Has nausea and reflux, will Rx Protonix 40 mg. Avoid marijuana

## 2013-06-26 NOTE — Patient Instructions (Addendum)
Pregnancy - Second Trimester The second trimester is the period between 13 to 27 weeks of your pregnancy. It is important to follow your doctor's instructions. HOME CARE   Do not smoke.  Do not drink alcohol or use drugs.  Only take medicine as told by your doctor.  Take prenatal vitamins as told. The vitamin should contain 1 milligram of folic acid.  Exercise.  Eat healthy foods. Eat regular, well-balanced meals.  You can have sex (intercourse) if there are no other problems with the pregnancy.  Do not use hot tubs, steam rooms, or saunas.  Wear a seat belt while driving.  Avoid raw meat, uncooked cheese, and litter boxes and soil used by cats.  Visit your dentist. Cleanings are okay. GET HELP RIGHT AWAY IF:   You have a temperature by mouth above 102 F (38.9 C), not controlled by medicine.  Fluid is coming from your vagina.  Blood is coming from your vagina. Light spotting is common, especially after sex (intercourse).  You have a bad smelling fluid (discharge) coming from the vagina. The fluid changes from clear to white.  You still feel sick to your stomach (nauseous).  You throw up (vomit) blood.  You lose or gain more than 2 pounds (0.9 kilograms) of weight in a week, or as suggested by your doctor.  Your face, hands, feet, or legs get puffy (swell).  You get exposed to German measles and have never had them.  You get exposed to fifth disease or chickenpox.  You have belly (abdominal) pain.  You have a bad headache that will not go away.  You have watery poop (diarrhea), pain when you pee (urinate), or have shortness of breath.  You start to have problems seeing (blurry or double vision).  You fall, are in a car accident, or have any kind of trauma.  There is mental or physical violence at home.  You have any concerns or worries during your pregnancy. MAKE SURE YOU:   Understand these instructions.  Will watch your condition.  Will get help  right away if you are not doing well or get worse. Document Released: 02/10/2010 Document Revised: 02/08/2012 Document Reviewed: 02/10/2010 ExitCare Patient Information 2014 [employer].  

## 2013-06-26 NOTE — Progress Notes (Signed)
Pt return visit for follow-up from last week with new dx of GDM. Pt given meter at that time and instructed how to use and to bring her meter and log book to this appt. Today, pt states she has not been monitoring her glucose but "once in a while", and she can't really tell me or what result was. I wanted to see the RD as she states she got no guidelines regarding portion sizes, grams cho, etc. Pt states she has had this education before, but today there was no evidence of nutrtiion knowledge. Pt eating a lot of fruit. Pt appeared extremely distracted with child being here, her grandmother have died last week, and pt appeared hyperactive.  She was having difficulty focusing and staying on task.  Pt to bring log book and meter next week, instructed on when to monitor every day, her glucose goals, her portion sizes of cho. Would like to possibly start her on medication due to her one fasting report of 147 mg/dL, however I cannot recommend anything without readings. Thank you, Lenor Coffin, RN, Diabetes CNS [phone])

## 2013-06-26 NOTE — Progress Notes (Signed)
Pulse-  108 Patient states she has had a very rough past couple weeks with her grandmother dying and has not taken any medications or checked her blood sugars like she should but seems ready to "get back on track"

## 2013-06-26 NOTE — Addendum Note (Signed)
Addended by: Franchot Mimes on: 06/26/2013 11:02 AM   Modules accepted: Orders

## 2013-06-26 NOTE — Progress Notes (Signed)
Fetal echo scheduled with Dr. Elizebeth Brooking on 07/17/13 at 8:30 am.

## 2013-06-27 LAB — PROTEIN, URINE, 24 HOUR: Protein, Urine: 8 mg/dL

## 2013-06-27 LAB — CREATININE CLEARANCE, URINE, 24 HOUR
Creatinine Clearance: 162 mL/min — ABNORMAL HIGH (ref 75–115)
Creatinine, 24H Ur: 1166 mg/d (ref 700–1800)
Creatinine, Urine: 155.5 mg/dL

## 2013-07-10 ENCOUNTER — Encounter: Payer: Medicaid Other | Admitting: Obstetrics and Gynecology

## 2013-07-11 ENCOUNTER — Encounter: Payer: Self-pay | Admitting: *Deleted

## 2013-07-17 ENCOUNTER — Encounter: Payer: Medicaid Other | Admitting: Family Medicine

## 2013-07-17 ENCOUNTER — Encounter: Payer: Self-pay | Admitting: Family Medicine

## 2013-08-07 ENCOUNTER — Encounter: Payer: Self-pay | Admitting: Family Medicine

## 2013-08-07 ENCOUNTER — Encounter: Payer: Medicaid Other | Admitting: Family Medicine

## 2013-08-14 ENCOUNTER — Encounter: Payer: Medicaid Other | Admitting: Obstetrics & Gynecology

## 2013-08-14 ENCOUNTER — Encounter: Payer: Self-pay | Admitting: *Deleted

## 2013-08-14 ENCOUNTER — Ambulatory Visit (INDEPENDENT_AMBULATORY_CARE_PROVIDER_SITE_OTHER): Payer: Medicaid Other | Admitting: Obstetrics & Gynecology

## 2013-08-14 ENCOUNTER — Encounter: Payer: Medicaid Other | Attending: Family Medicine | Admitting: *Deleted

## 2013-08-14 VITALS — BP 113/76 | Wt 198.6 lb

## 2013-08-14 DIAGNOSIS — E119 Type 2 diabetes mellitus without complications: Secondary | ICD-10-CM | POA: Insufficient documentation

## 2013-08-14 DIAGNOSIS — O24919 Unspecified diabetes mellitus in pregnancy, unspecified trimester: Secondary | ICD-10-CM | POA: Insufficient documentation

## 2013-08-14 DIAGNOSIS — O0992 Supervision of high risk pregnancy, unspecified, second trimester: Secondary | ICD-10-CM

## 2013-08-14 DIAGNOSIS — O24913 Unspecified diabetes mellitus in pregnancy, third trimester: Secondary | ICD-10-CM

## 2013-08-14 LAB — POCT URINALYSIS DIP (DEVICE)
Bilirubin Urine: NEGATIVE
Ketones, ur: NEGATIVE mg/dL
Nitrite: NEGATIVE
pH: 6 (ref 5.0–8.0)

## 2013-08-14 LAB — HEMOGLOBIN A1C: Hgb A1c MFr Bld: 6.1 % — ABNORMAL HIGH (ref <5.7)

## 2013-08-14 MED ORDER — ACCU-CHEK FASTCLIX LANCETS MISC: 1.0000 | Freq: Four times a day (QID) | Status: DC

## 2013-08-14 MED ORDER — PROMETHAZINE HCL 25 MG PO TABS: 25.0000 mg | ORAL_TABLET | Freq: Four times a day (QID) | ORAL | Status: DC | PRN

## 2013-08-14 MED ORDER — GLYBURIDE 2.5 MG PO TABS: 2.5000 mg | ORAL_TABLET | Freq: Two times a day (BID) | ORAL | Status: DC

## 2013-08-14 MED ORDER — ACCU-CHEK NANO SMARTVIEW W/DEVICE KIT: 1.0000 | PACK | Status: DC

## 2013-08-14 MED ORDER — DIPHENHYDRAMINE HCL 25 MG PO TABS: 25.0000 mg | ORAL_TABLET | Freq: Every evening | ORAL | Status: DC | PRN

## 2013-08-14 NOTE — Progress Notes (Signed)
Pt has not been seen for 2 months.  Pt not taking any meds for diabetes--didn't have medicaid.  Was supposed to be on metformin.   CBG after [place] 1 hour ago = 280 (2 sausage biscuits and juice).  Will start glyburide 2.5 mg bid.  Will increase as necessary or change to insulin ifnecessary. History of IUGR and fetal distress needing c/s.  Will send for BPP and growth today due to uncontrolled diabetes.   Pt encouraged to stop smoking. Phenergan for nausea; if still having problems for sleep, try benadryl.

## 2013-08-14 NOTE — Progress Notes (Signed)
DIABETES: Patient comes to me due to uncontrolled diabetes. Patient was diagnosed with T2DM at the age of 89. She previously met with DM education & Nutrition in July but has not been testing nor following nutrition guidelines. She has also missed her doctor visits. I stressed the importance of all for safety of pregnancy. She acted surprised that there should be any concern. I reinforced testing regimen and need to take new RX of glyburide. We will have her revisit dietitian next week for f/u. Frankie is in need of RX for test strips and lancets. Has not been taking her Metformin due to no insurance. We will need to discuss medication options for after delivery ([employer] or [employer] programs).

## 2013-08-14 NOTE — Progress Notes (Signed)
U/S scheduled 08/15/13 at 1015 am with the U/S department.

## 2013-08-14 NOTE — Progress Notes (Signed)
Pulse: 115 Needs rx for PNV. Pt not checking blood sugars.

## 2013-08-15 ENCOUNTER — Other Ambulatory Visit: Payer: Self-pay | Admitting: Obstetrics & Gynecology

## 2013-08-15 ENCOUNTER — Ambulatory Visit (HOSPITAL_COMMUNITY)
Admission: RE | Admit: 2013-08-15 | Discharge: 2013-08-15 | Disposition: A | Discharge status: Home / Self Care | Payer: Medicaid Other | Source: Ambulatory Visit | Attending: Obstetrics & Gynecology | Admitting: Obstetrics & Gynecology

## 2013-08-15 ENCOUNTER — Encounter: Payer: Self-pay | Admitting: Obstetrics & Gynecology

## 2013-08-15 DIAGNOSIS — O24919 Unspecified diabetes mellitus in pregnancy, unspecified trimester: Secondary | ICD-10-CM | POA: Insufficient documentation

## 2013-08-15 DIAGNOSIS — O24913 Unspecified diabetes mellitus in pregnancy, third trimester: Secondary | ICD-10-CM

## 2013-08-15 DIAGNOSIS — O99330 Smoking (tobacco) complicating pregnancy, unspecified trimester: Secondary | ICD-10-CM | POA: Insufficient documentation

## 2013-08-15 DIAGNOSIS — E669 Obesity, unspecified: Secondary | ICD-10-CM | POA: Insufficient documentation

## 2013-08-15 DIAGNOSIS — E119 Type 2 diabetes mellitus without complications: Secondary | ICD-10-CM

## 2013-08-15 DIAGNOSIS — Z8751 Personal history of pre-term labor: Secondary | ICD-10-CM | POA: Insufficient documentation

## 2013-08-15 DIAGNOSIS — O34219 Maternal care for unspecified type scar from previous cesarean delivery: Secondary | ICD-10-CM | POA: Insufficient documentation

## 2013-08-15 DIAGNOSIS — Z3689 Encounter for other specified antenatal screening: Secondary | ICD-10-CM | POA: Insufficient documentation

## 2013-08-15 NOTE — Progress Notes (Signed)
Please schedule and call pt.  It can be at [place], [place], or [employer]

## 2013-08-15 NOTE — Progress Notes (Signed)
Called pt and informed her of appt for fetal echocardiogram @ [facility] physicians- Dr. Elizebeth Brooking on 08/28/13 @ 0930. Pt agreed and voiced understanding.  Result of [place] from today and [facility] pre-auth #  faxed to Dr. Casilda Carls office.

## 2013-08-15 NOTE — Progress Notes (Signed)
MFM ultrasound  Indication: 27 yr old G3P1011 at [redacted]w[redacted]d with type II diabetes and previous child with growth restriction for follow up ultrasound and BPP. Remote read.  Findings: 1. Single intrauterine pregnancy. 2. Estimated fetal weight is in the 39th%. 3. Posterior placenta without evidence of previa. 4. Normal amniotic fluid index. 5. The views of the heart remain limited. 6. The remainder of the limited anatomy survey is normal. Any anatomy not evaluated on today's exam was evaluated on the previous exam. 7. Normal biophysical profile of 8/8.   Recommendations: 1. Appropriate fetal growth. 2. Limited anatomy survey: - heart views could not be cleared given this in the context of type II diabetes recommend fetal echocardiogram asap (if not already done) 3. Diabetes: - on glyburide - recommend fetal growth every 4 weeks - recommend start antenatal testing at 32 weeks - recommend delivery by estimated due date but not prior to 39 weeks in the absence of other complications - recommend fetal echocardiogram as above 4. Previous child with growth restriction: - appropriate fetal growth today - recommend follow fetal growth as above  Eulis Foster, MD

## 2013-08-17 ENCOUNTER — Encounter: Payer: Self-pay | Admitting: *Deleted

## 2013-08-21 ENCOUNTER — Ambulatory Visit (INDEPENDENT_AMBULATORY_CARE_PROVIDER_SITE_OTHER): Payer: Medicaid Other | Admitting: Obstetrics & Gynecology

## 2013-08-21 VITALS — BP 125/81 | Temp 96.9°F | Wt 199.5 lb

## 2013-08-21 DIAGNOSIS — O24919 Unspecified diabetes mellitus in pregnancy, unspecified trimester: Secondary | ICD-10-CM

## 2013-08-21 DIAGNOSIS — O10019 Pre-existing essential hypertension complicating pregnancy, unspecified trimester: Secondary | ICD-10-CM

## 2013-08-21 DIAGNOSIS — Z8619 Personal history of other infectious and parasitic diseases: Secondary | ICD-10-CM

## 2013-08-21 DIAGNOSIS — O24312 Unspecified pre-existing diabetes mellitus in pregnancy, second trimester: Secondary | ICD-10-CM

## 2013-08-21 DIAGNOSIS — O10912 Unspecified pre-existing hypertension complicating pregnancy, second trimester: Secondary | ICD-10-CM

## 2013-08-21 LAB — POCT URINALYSIS DIP (DEVICE)
Glucose, UA: 500 mg/dL — AB
Ketones, ur: NEGATIVE mg/dL
Leukocytes, UA: NEGATIVE
Protein, ur: NEGATIVE mg/dL
Urobilinogen, UA: 0.2 mg/dL (ref 0.0–1.0)

## 2013-08-21 MED ORDER — PRENATAL VIT-FE FUMARATE-FA 29-1 MG PO CHEW: 1.0000 | CHEWABLE_TABLET | Freq: Every day | ORAL | Status: DC

## 2013-08-21 NOTE — Progress Notes (Signed)
Pulse- 113 Pt needs PNV Rx that Medicaid will cover

## 2013-08-21 NOTE — Progress Notes (Signed)
FBS 76-118, pp  123-161  Will increase to 5 mg in AM.

## 2013-08-21 NOTE — Patient Instructions (Signed)

## 2013-08-23 ENCOUNTER — Ambulatory Visit (HOSPITAL_COMMUNITY): Payer: Medicaid Other

## 2013-09-11 ENCOUNTER — Encounter: Payer: Medicaid Other | Admitting: Obstetrics & Gynecology

## 2013-09-18 ENCOUNTER — Ambulatory Visit (INDEPENDENT_AMBULATORY_CARE_PROVIDER_SITE_OTHER): Payer: Medicaid Other | Admitting: Obstetrics and Gynecology

## 2013-09-18 ENCOUNTER — Encounter: Payer: Self-pay | Admitting: Obstetrics and Gynecology

## 2013-09-18 VITALS — BP 145/101 | Temp 97.8°F | Wt 205.9 lb

## 2013-09-18 DIAGNOSIS — Z8619 Personal history of other infectious and parasitic diseases: Secondary | ICD-10-CM

## 2013-09-18 DIAGNOSIS — E119 Type 2 diabetes mellitus without complications: Secondary | ICD-10-CM

## 2013-09-18 DIAGNOSIS — O34219 Maternal care for unspecified type scar from previous cesarean delivery: Secondary | ICD-10-CM

## 2013-09-18 DIAGNOSIS — O24312 Unspecified pre-existing diabetes mellitus in pregnancy, second trimester: Secondary | ICD-10-CM

## 2013-09-18 DIAGNOSIS — O0992 Supervision of high risk pregnancy, unspecified, second trimester: Secondary | ICD-10-CM

## 2013-09-18 DIAGNOSIS — O24919 Unspecified diabetes mellitus in pregnancy, unspecified trimester: Secondary | ICD-10-CM

## 2013-09-18 LAB — POCT URINALYSIS DIP (DEVICE)
Bilirubin Urine: NEGATIVE
Glucose, UA: 500 mg/dL — AB
Nitrite: NEGATIVE
Specific Gravity, Urine: 1.015 (ref 1.005–1.030)
Urobilinogen, UA: 0.2 mg/dL (ref 0.0–1.0)

## 2013-09-18 NOTE — Progress Notes (Signed)
Pulse- 99  Edema-feet  Pain-lower abd "cramping"

## 2013-09-18 NOTE — Progress Notes (Signed)
Patient doing well without complaints. FM.PTL precautions reviewed. Patient did not bring log book today but reports highest fast in 140's (due to bedtime snaking) and 2 hr pp highest in 130's. Reviewed importance of adhering to diet. Patient unable to stay for fetal testing today but can return tomorrow.

## 2013-09-19 ENCOUNTER — Other Ambulatory Visit: Payer: Medicaid Other

## 2013-09-21 ENCOUNTER — Other Ambulatory Visit: Payer: Medicaid Other

## 2013-09-22 ENCOUNTER — Other Ambulatory Visit: Payer: Medicaid Other

## 2013-09-25 ENCOUNTER — Inpatient Hospital Stay (HOSPITAL_COMMUNITY): Payer: Medicaid Other

## 2013-09-25 ENCOUNTER — Observation Stay (HOSPITAL_COMMUNITY)
Admission: AD | Admit: 2013-09-25 | Discharge: 2013-09-26 | Disposition: A | Discharge status: Home / Self Care | Payer: Medicaid Other | Source: Ambulatory Visit | Attending: Obstetrics & Gynecology | Admitting: Obstetrics & Gynecology

## 2013-09-25 ENCOUNTER — Ambulatory Visit (INDEPENDENT_AMBULATORY_CARE_PROVIDER_SITE_OTHER): Payer: Medicaid Other | Admitting: Obstetrics & Gynecology

## 2013-09-25 ENCOUNTER — Encounter (HOSPITAL_COMMUNITY): Payer: Self-pay | Admitting: *Deleted

## 2013-09-25 ENCOUNTER — Observation Stay (HOSPITAL_COMMUNITY)
Admission: AD | Admit: 2013-09-25 | Discharge: 2013-09-25 | Disposition: A | Discharge status: Home / Self Care | Payer: Medicaid Other | Source: Ambulatory Visit | Attending: Obstetrics & Gynecology | Admitting: Obstetrics & Gynecology

## 2013-09-25 ENCOUNTER — Encounter: Payer: Self-pay | Admitting: Obstetrics & Gynecology

## 2013-09-25 ENCOUNTER — Encounter (HOSPITAL_COMMUNITY): Payer: Self-pay

## 2013-09-25 VITALS — BP 143/99 | Temp 97.2°F

## 2013-09-25 DIAGNOSIS — O24919 Unspecified diabetes mellitus in pregnancy, unspecified trimester: Secondary | ICD-10-CM

## 2013-09-25 DIAGNOSIS — E669 Obesity, unspecified: Secondary | ICD-10-CM

## 2013-09-25 DIAGNOSIS — O0990 Supervision of high risk pregnancy, unspecified, unspecified trimester: Secondary | ICD-10-CM

## 2013-09-25 DIAGNOSIS — O0992 Supervision of high risk pregnancy, unspecified, second trimester: Secondary | ICD-10-CM

## 2013-09-25 DIAGNOSIS — IMO0002 Reserved for concepts with insufficient information to code with codable children: Principal | ICD-10-CM | POA: Insufficient documentation

## 2013-09-25 DIAGNOSIS — E119 Type 2 diabetes mellitus without complications: Secondary | ICD-10-CM

## 2013-09-25 DIAGNOSIS — O34219 Maternal care for unspecified type scar from previous cesarean delivery: Secondary | ICD-10-CM

## 2013-09-25 DIAGNOSIS — O99810 Abnormal glucose complicating pregnancy: Secondary | ICD-10-CM | POA: Insufficient documentation

## 2013-09-25 DIAGNOSIS — O99891 Other specified diseases and conditions complicating pregnancy: Secondary | ICD-10-CM | POA: Insufficient documentation

## 2013-09-25 DIAGNOSIS — O368390 Maternal care for abnormalities of the fetal heart rate or rhythm, unspecified trimester, not applicable or unspecified: Secondary | ICD-10-CM | POA: Insufficient documentation

## 2013-09-25 DIAGNOSIS — O24312 Unspecified pre-existing diabetes mellitus in pregnancy, second trimester: Secondary | ICD-10-CM

## 2013-09-25 DIAGNOSIS — Z22330 Carrier of Group B streptococcus: Secondary | ICD-10-CM | POA: Insufficient documentation

## 2013-09-25 LAB — COMPREHENSIVE METABOLIC PANEL
ALT: 11 U/L (ref 0–35)
AST: 15 U/L (ref 0–37)
Albumin: 2.5 g/dL — ABNORMAL LOW (ref 3.5–5.2)
Alkaline Phosphatase: 113 U/L (ref 39–117)
CO2: 23 mEq/L (ref 19–32)
Calcium: 8.5 mg/dL (ref 8.4–10.5)
Chloride: 103 mEq/L (ref 96–112)
Creatinine, Ser: 0.51 mg/dL (ref 0.50–1.10)
GFR calc Af Amer: >90 mL/min (ref >90)
GFR calc non Af Amer: >90 mL/min (ref >90)
Potassium: 3.3 mEq/L — ABNORMAL LOW (ref 3.5–5.1)
Sodium: 136 mEq/L (ref 135–145)
Total Protein: 5.9 g/dL — ABNORMAL LOW (ref 6.0–8.3)

## 2013-09-25 LAB — CBC
HCT: 33.2 % — ABNORMAL LOW (ref 36.0–46.0)
Hemoglobin: 11.6 g/dL — ABNORMAL LOW (ref 12.0–15.0)
MCH: 32.7 pg (ref 26.0–34.0)
MCHC: 34.9 g/dL (ref 30.0–36.0)
MCV: 93.5 fL (ref 78.0–100.0)
Platelets: 251 10*3/uL (ref 150–400)
RDW: 13.8 % (ref 11.5–15.5)
WBC: 11.6 10*3/uL — ABNORMAL HIGH (ref 4.0–10.5)

## 2013-09-25 LAB — POCT URINALYSIS DIP (DEVICE)
Bilirubin Urine: NEGATIVE
Glucose, UA: 250 mg/dL — AB
Leukocytes, UA: NEGATIVE
Nitrite: NEGATIVE

## 2013-09-25 LAB — PROTEIN / CREATININE RATIO, URINE
Protein Creatinine Ratio: 0.72 — ABNORMAL HIGH (ref 0.00–0.15)
Total Protein, Urine: 49.8 mg/dL

## 2013-09-25 MED ORDER — ZOLPIDEM TARTRATE 5 MG PO TABS
5.0000 mg | ORAL_TABLET | Freq: Every evening | ORAL | Status: DC | PRN
  Administered 2013-09-26: 5 mg via ORAL
  Filled 2013-09-25: qty 1

## 2013-09-25 MED ORDER — CALCIUM CARBONATE ANTACID 500 MG PO CHEW: 2.0000 | CHEWABLE_TABLET | ORAL | Status: DC | PRN

## 2013-09-25 MED ORDER — ACETAMINOPHEN 325 MG PO TABS
650.0000 mg | ORAL_TABLET | ORAL | Status: DC | PRN
  Administered 2013-09-26: 650 mg via ORAL
  Filled 2013-09-25: qty 2

## 2013-09-25 MED ORDER — PRENATAL MULTIVITAMIN CH
1.0000 | ORAL_TABLET | Freq: Every day | ORAL | Status: DC
  Administered 2013-09-26: 1 via ORAL
  Filled 2013-09-25: qty 1

## 2013-09-25 MED ORDER — GLYBURIDE 2.5 MG PO TABS
2.5000 mg | ORAL_TABLET | Freq: Every evening | ORAL | Status: DC
  Administered 2013-09-25 – 2013-09-26 (×2): 2.5 mg via ORAL
  Filled 2013-09-25 (×2): qty 1

## 2013-09-25 MED ORDER — LACTATED RINGERS IV SOLN: INTRAVENOUS | Status: DC

## 2013-09-25 MED ORDER — ZOLPIDEM TARTRATE 5 MG PO TABS: 5.0000 mg | ORAL_TABLET | Freq: Every evening | ORAL | Status: DC | PRN

## 2013-09-25 MED ORDER — DOCUSATE SODIUM 100 MG PO CAPS: 100.0000 mg | ORAL_CAPSULE | Freq: Every day | ORAL | Status: DC

## 2013-09-25 MED ORDER — PRENATAL MULTIVITAMIN CH: 1.0000 | ORAL_TABLET | Freq: Every day | ORAL | Status: DC

## 2013-09-25 MED ORDER — GLYBURIDE 5 MG PO TABS
5.0000 mg | ORAL_TABLET | Freq: Every day | ORAL | Status: DC
  Administered 2013-09-26: 5 mg via ORAL
  Filled 2013-09-25 (×2): qty 1

## 2013-09-25 MED ORDER — ACETAMINOPHEN 325 MG PO TABS: 650.0000 mg | ORAL_TABLET | ORAL | Status: DC | PRN

## 2013-09-25 NOTE — MAU Note (Signed)
Patient is sent from [facility] clinic for elevated BP. She denies any pain, headache, visual disturbance, epigastric pain, vaginal bleeding or lof. Patient reports good fetal movement.

## 2013-09-25 NOTE — Progress Notes (Signed)
P= 110 Pt. C/o of intermittent lower abdominal/pelvic pain.

## 2013-09-25 NOTE — Progress Notes (Signed)
Patient doing well without complaints. Again did not bring log book today but reports fasting highest 111 and 2hr pp in 130's. Patient with elevated BP today and last week with 2+ proteinuria. Patient denies other symptoms. Will send to MAU

## 2013-09-25 NOTE — MAU Provider Note (Deleted)
History     CSN: [id number]  Arrival date and time: 09/25/13 1140   None     Chief Complaint  Patient presents with  . Hypertension   Hypertension Pertinent negatives include no blurred vision, chest pain, headaches, palpitations or shortness of breath.    Vickie Gonzales is a 27 y/o G3P1011 who presents today at [redacted]w[redacted]d after being referred from a regular prenatal appointment this morning at [facility] for high BP.  She has a hx of GDM, but denies previous BP problems. Pt. Is taking glyburide 5mg  in the am and 2.5 mg at hs. She states that her fasting CBGs have been as high as 111, and her 2-hr pp as high as 130s.  Pt. Is unsure how high her BP was at clinic this morning, but was told it was high.  She has no allergies and is taking no other medications, other than occasional tums prn. GBS (+).    Vickie Gonzales denies any sx at this time, other than mild lower abdominal discomfort.  She denies any HA, vision changes, N/V, heartburn, abdominal pain, contractions, vaginal bleeding or discharge, fluid leakage, dysuria, diarrhea, constipation, fever or chills.  She does report mild increased LE edema for the past week, but it is not excessive and normal compared to her previous pregnancy.  No hand edema noted.  She reports a BP reading of 150/94 in the MAU 10 minutes prior interview.   Past Medical History  Diagnosis Date  . Hypertension   . Diabetes mellitus     On insulin during pregnancy otherwise oral meds    Past Surgical History  Procedure Laterality Date  . Cesarean section      Family History  Problem Relation Age of Onset  . Diabetes Mother   . Cancer Mother     breast cancer    History  Substance Use Topics  . Smoking status: Current Every Day Smoker -- 0.25 packs/day    Types: Cigarettes  . Smokeless tobacco: Never Used  . Alcohol Use: No     Comment: occasionally    Allergies: No Known Allergies  Prescriptions prior to admission  Medication Sig Dispense Refill  .  diphenhydrAMINE (BENADRYL) 25 MG tablet Take 1 tablet (25 mg total) by mouth at bedtime as needed for sleep.  30 tablet  2  . glyBURIDE (DIABETA) 2.5 MG tablet Take 1 tablet (2.5 mg total) by mouth 2 (two) times daily with a meal.  60 tablet  3  . ACCU-CHEK FASTCLIX LANCETS MISC 1 each by Percutaneous route 4 (four) times daily.  100 each  12  . Blood Glucose Monitoring Suppl (ACCU-CHEK NANO SMARTVIEW) W/DEVICE KIT 1 each by Does not apply route as directed. Check fasting, and two hours after breakfast, lunch and dinner  1 kit  0  . glucose blood (ACCU-CHEK SMARTVIEW) test strip Use as instructed  100 each  12  . Prenatal Vit-Fe Fumarate-FA 29-1 MG CHEW Chew 1 tablet by mouth daily.  30 each  12    Review of Systems  Constitutional: Negative for fever, chills and diaphoresis.  Eyes: Negative for blurred vision.  Respiratory: Negative for shortness of breath.   Cardiovascular: Positive for leg swelling. Negative for chest pain and palpitations.       Mild bilateral foot swelling per pt.   Gastrointestinal: Negative for heartburn, nausea, vomiting, abdominal pain, diarrhea and constipation.  Genitourinary: Negative for dysuria.  Musculoskeletal: Negative for myalgias.  Neurological: Negative for dizziness and headaches.   Physical  Exam   Blood pressure 154/99, pulse 92, temperature 97.6 F (36.4 C), temperature source Oral, resp. rate 18, last menstrual period 01/26/2013, SpO2 96.00%.  Physical Exam  Constitutional: She is oriented to person, place, and time. She appears well-developed and well-nourished. No distress.  HENT:  Head: Normocephalic.  Eyes: EOM are normal.  Neck: Neck supple.  Cardiovascular: Normal rate, regular rhythm and intact distal pulses.  Exam reveals no gallop and no friction rub.   No murmur heard. Respiratory: Effort normal and breath sounds normal. No respiratory distress. She has no wheezes.  GI: Soft. Bowel sounds are normal. There is no tenderness.   Musculoskeletal: She exhibits edema.  Trace bilateral pedal/ankle edema, non-pitting  Neurological: She is alert and oriented to person, place, and time.  Skin: Skin is warm. She is not diaphoretic.  Psychiatric: She has a normal mood and affect.    MAU Course  Procedures  MDM CBC, CMP, Protein/Cr ratio  Assessment and Plan  A:  PChristiana Gonzales 09/25/2013, 12:26 PM

## 2013-09-25 NOTE — H&P (Signed)
History     CSN: [id number]  Arrival date and time: 09/25/13 1140   None     Chief Complaint  Patient presents with  . Hypertension   Hypertension Pertinent negatives include no blurred vision, chest pain, headaches, palpitations or shortness of breath.    Vickie Gonzales is a 27 y/o G3P1011 who presents today at [redacted]w[redacted]d after being referred from a regular prenatal appointment this morning at [facility] for high BP.  She has a hx of GDM, but denies previous BP problems. Pt. Is taking glyburide 5mg  in the am and 2.5 mg at hs. She states that her fasting CBGs have been as high as 111, and her 2-hr pp as high as 130s.  Pt. Is unsure how high her BP was at clinic this morning, but was told it was high.  She has no allergies and is taking no other medications, other than occasional tums prn. GBS (+).    Vickie Gonzales denies any sx at this time, other than mild lower abdominal discomfort.  She denies any HA, vision changes, N/V, heartburn, abdominal pain, contractions, vaginal bleeding or discharge, fluid leakage, dysuria, diarrhea, constipation, fever or chills.  She does report mild increased LE edema for the past week, but it is not excessive and normal compared to her previous pregnancy.  No hand edema noted.  She reports a BP reading of 150/94 in the MAU 10 minutes prior interview.   Past Medical History  Diagnosis Date  . Hypertension   . Diabetes mellitus     On insulin during pregnancy otherwise oral meds    Past Surgical History  Procedure Laterality Date  . Cesarean section      Family History  Problem Relation Age of Onset  . Diabetes Mother   . Cancer Mother     breast cancer    History  Substance Use Topics  . Smoking status: Current Every Day Smoker -- 0.25 packs/day    Types: Cigarettes  . Smokeless tobacco: Never Used  . Alcohol Use: No     Comment: occasionally    Allergies: No Known Allergies  Prescriptions prior to admission  Medication Sig Dispense Refill  .  diphenhydrAMINE (BENADRYL) 25 MG tablet Take 1 tablet (25 mg total) by mouth at bedtime as needed for sleep.  30 tablet  2  . glyBURIDE (DIABETA) 2.5 MG tablet Take 1 tablet (2.5 mg total) by mouth 2 (two) times daily with a meal.  60 tablet  3  . ACCU-CHEK FASTCLIX LANCETS MISC 1 each by Percutaneous route 4 (four) times daily.  100 each  12  . Blood Glucose Monitoring Suppl (ACCU-CHEK NANO SMARTVIEW) W/DEVICE KIT 1 each by Does not apply route as directed. Check fasting, and two hours after breakfast, lunch and dinner  1 kit  0  . glucose blood (ACCU-CHEK SMARTVIEW) test strip Use as instructed  100 each  12  . Prenatal Vit-Fe Fumarate-FA 29-1 MG CHEW Chew 1 tablet by mouth daily.  30 each  12    Review of Systems  Constitutional: Negative for fever, chills and diaphoresis.  Eyes: Negative for blurred vision.  Respiratory: Negative for shortness of breath.   Cardiovascular: Positive for leg swelling. Negative for chest pain and palpitations.       Mild bilateral foot swelling per pt.   Gastrointestinal: Negative for heartburn, nausea, vomiting, abdominal pain, diarrhea and constipation.  Genitourinary: Negative for dysuria.  Musculoskeletal: Negative for myalgias.  Neurological: Negative for dizziness and headaches.   Physical  Exam   Blood pressure 154/99, pulse 92, temperature 97.6 F (36.4 C), temperature source Oral, resp. rate 18, last menstrual period 01/26/2013, SpO2 96.00%.  Filed Vitals:   09/25/13 1204 09/25/13 1217 09/25/13 1247 09/25/13 1303  BP: 134/104 154/99 153/104 155/103  Pulse: 110 92 90 95  Temp:      TempSrc:      Resp:      SpO2:         Physical Exam  Constitutional: She is oriented to person, place, and time. She appears well-developed and well-nourished. No distress.  HENT:  Head: Normocephalic.  Eyes: EOM are normal.  Neck: Neck supple.  Cardiovascular: Normal rate, regular rhythm and intact distal pulses.  Exam reveals no gallop and no friction  rub.   No murmur heard. Respiratory: Effort normal and breath sounds normal. No respiratory distress. She has no wheezes.  GI: Soft. Bowel sounds are normal. There is no tenderness.  Musculoskeletal: She exhibits edema.  Trace bilateral pedal/ankle edema, non-pitting  Neurological: She is alert and oriented to person, place, and time.  Skin: Skin is warm. She is not diaphoretic.  Psychiatric: She has a normal mood and affect.   FHT: [place]  MAU Course  Procedures  MDM CBC, CMP, Protein/Cr ratio  CBC    Component Value Date/Time   WBC 11.6* 09/25/2013 1225   RBC 3.55* 09/25/2013 1225   HGB 11.6* 09/25/2013 1225   HCT 33.2* 09/25/2013 1225   PLT 251 09/25/2013 1225   MCV 93.5 09/25/2013 1225   MCH 32.7 09/25/2013 1225   MCHC 34.9 09/25/2013 1225   RDW 13.8 09/25/2013 1225   LYMPHSABS 2.0 06/05/2013 1148   MONOABS 0.9 06/05/2013 1148   EOSABS 0.1 06/05/2013 1148   BASOSABS 0.0 06/05/2013 1148   CMP     Component Value Date/Time   NA 136 09/25/2013 1225   K 3.3* 09/25/2013 1225   CL 103 09/25/2013 1225   CO2 23 09/25/2013 1225   GLUCOSE 141* 09/25/2013 1225   BUN 3* 09/25/2013 1225   CREATININE 0.51 09/25/2013 1225   CREATININE 0.50 06/26/2013 1102   CREATININE 0.50 06/26/2013 1102   CALCIUM 8.5 09/25/2013 1225   PROT 5.9* 09/25/2013 1225   ALBUMIN 2.5* 09/25/2013 1225   AST 15 09/25/2013 1225   ALT 11 09/25/2013 1225   ALKPHOS 113 09/25/2013 1225   BILITOT 0.2* 09/25/2013 1225   GFRNONAA >90 09/25/2013 1225   GFRAA >90 09/25/2013 1225    Pro:cr 0.7  [place] with normal AFI and growth  Assessment and Plan  Vickie Gonzales is a 27 y.o. G3P1011 at [redacted]w[redacted]d by L= 18 with preeclampsia based on new onset HTN with elevated Pro:Cr. Currently without severe features. Will monitor BPs overnight, collect 24 hr urine and obs.  Preeclampsia without severe features: labs stable, monitor overnight, consider intervention if BPs >160s/105. Will use labetalol 10mg  IV PRN. q2 Bps overnight,  Q1 at 0500  GDMA2: continue home glyburide 5mg /2.5 and collect routine sugars while in house.   Pt left AMA to care for children and returned for admission. Pt is being directly admitted to ante for observation.  FEN: SLIV/replete PRN/Carb controlled diet  FULL CODE  Tawana Scale, MD OB Fellow

## 2013-09-26 ENCOUNTER — Encounter (HOSPITAL_COMMUNITY): Payer: Self-pay | Admitting: Obstetrics and Gynecology

## 2013-09-26 DIAGNOSIS — IMO0002 Reserved for concepts with insufficient information to code with codable children: Principal | ICD-10-CM

## 2013-09-26 LAB — GLUCOSE, CAPILLARY
Glucose-Capillary: 100 mg/dL — ABNORMAL HIGH (ref 70–99)
Glucose-Capillary: 124 mg/dL — ABNORMAL HIGH (ref 70–99)

## 2013-09-26 LAB — TYPE AND SCREEN
ABO/RH(D): O POS
Antibody Screen: NEGATIVE

## 2013-09-26 LAB — PROTEIN, URINE, 24 HOUR: Protein, 24H Urine: 1288 mg/d — ABNORMAL HIGH (ref 50–100)

## 2013-09-26 LAB — ABO/RH: ABO/RH(D): O POS

## 2013-09-26 NOTE — Progress Notes (Signed)
Dr. Penne Lash at bedside talking with pt

## 2013-09-26 NOTE — Progress Notes (Signed)
Patient ID: Vickie Gonzales, female   DOB: [birth year], 27 y.o.   MRN: [id number]  S: 27 y.o. [id number] @[redacted]w[redacted]d  admitted on 10/27 for elevated BP.  Pt completed 24 hour urine at 7 pm on 10/28 which was sent to lab.  CNM called to bedside by RN because pt was under the impression that she would be discharged tonight after 24 hour urine.  She does not have childcare tomorrow and her husband is going to work in the morning.  She denies h/a, epigastric pain, visual disturbances, n/v, or fever chills.  She reports good fetal movement.    O: Patient Vitals for the past 24 hrs:  BP Temp Temp src Pulse Resp  09/26/13 2000 155/92 mmHg - - 95 -  09/26/13 1905 148/88 mmHg - - 90 18  09/26/13 1816 157/99 mmHg - - 103 18  09/26/13 1637 154/99 mmHg - - 93 -  09/26/13 1612 151/91 mmHg - - 89 -  09/26/13 1611 - 98.2 F (36.8 C) Oral - 18  09/26/13 1400 150/84 mmHg - - 100 18  09/26/13 1242 155/98 mmHg 98.1 F (36.7 C) Oral 89 18  09/26/13 0756 155/98 mmHg 97.7 F (36.5 C) Oral 92 18  09/26/13 0755 155/102 mmHg - - 92 -  09/26/13 0701 150/92 mmHg - - 90 -  09/26/13 0501 146/90 mmHg - - 94 18  09/26/13 0301 136/83 mmHg - - 99 18  09/26/13 0100 155/95 mmHg - - 88 18  09/26/13 0033 148/97 mmHg - - 99 18  09/25/13 2347 157/97 mmHg - - 95 18   FHR baseline 145 with positive accels, no decels, moderate variability No contractions on toco or to palpation  DTRs 2+ BLE  A: Preeclampsia without severe features @34 .5 weeks  P: Consult Dr Debroah Loop Dr Debroah Loop to bedside to see pt   Sharen Counter Certified Nurse-Midwife

## 2013-09-26 NOTE — Progress Notes (Signed)
UR completed 

## 2013-09-26 NOTE — Progress Notes (Signed)
Patient ID: Vickie Gonzales, female   DOB: Sep 14, 1986, 27 y.o.   MRN: [id number]  FACULTY PRACTICE ANTEPARTUM(COMPREHENSIVE) NOTE  Vickie Gonzales is a 27 y.o. G3P1011 at [redacted]w[redacted]d  who is admitted for preeclampsia evaluation.   Length of Stay:  1  Days  Subjective: Pt denies headache, scotomata, RUQ pain Patient reports the fetal movement as active. Patient reports uterine contraction  activity as none. Patient reports  vaginal bleeding as none. Patient describes fluid per vagina as None.  Vitals:  Blood pressure 146/90, pulse 94, temperature 98 F (36.7 C), temperature source Oral, resp. rate 18, height 5\' 5"  (1.651 m), weight 206 lb (93.441 kg), last menstrual period 01/26/2013. Physical Examination:  General:  Well, nourished, no distress Abdomen:  Soft, NT, gravid Ext:  NT, neg Homans  Fetal Monitoring:  Baseline: 145 bpm, Variability: Good {> 6 bpm), Accelerations: Reactive and Decelerations: Variable: mild  Labs:  Results for orders placed during the hospital encounter of 09/25/13 (from the past 24 hour(s))  GLUCOSE, CAPILLARY   Collection Time    09/25/13 11:06 PM      Result Value Range   Glucose-Capillary 121 (*) 70 - 99 mg/dL  GLUCOSE, CAPILLARY   Collection Time    09/26/13  6:33 AM      Result Value Range   Glucose-Capillary 93  70 - 99 mg/dL  PROTEIN / CREATININE RATIO, URINE   Collection Time    09/25/13 11:50 AM      Result Value Range   Creatinine, Urine 69.36     Total Protein, Urine 49.8     PROTEIN CREATININE RATIO 0.72 (*) 0.00 - 0.15  CBC   Collection Time    09/25/13 12:25 PM      Result Value Range   WBC 11.6 (*) 4.0 - 10.5 K/uL   RBC 3.55 (*) 3.87 - 5.11 MIL/uL   Hemoglobin 11.6 (*) 12.0 - 15.0 g/dL   HCT 40.9 (*) 81.1 - 91.4 %   MCV 93.5  78.0 - 100.0 fL   MCH 32.7  26.0 - 34.0 pg   MCHC 34.9  30.0 - 36.0 g/dL   RDW 78.2  95.6 - 21.3 %   Platelets 251  150 - 400 K/uL  COMPREHENSIVE METABOLIC PANEL   Collection Time    09/25/13 12:25 PM    Result Value Range   Sodium 136  135 - 145 mEq/L   Potassium 3.3 (*) 3.5 - 5.1 mEq/L   Chloride 103  96 - 112 mEq/L   CO2 23  19 - 32 mEq/L   Glucose, Bld 141 (*) 70 - 99 mg/dL   BUN 3 (*) 6 - 23 mg/dL   Creatinine, Ser 0.86  0.50 - 1.10 mg/dL   Calcium 8.5  8.4 - 57.8 mg/dL   Total Protein 5.9 (*) 6.0 - 8.3 g/dL   Albumin 2.5 (*) 3.5 - 5.2 g/dL   AST 15  0 - 37 U/L   ALT 11  0 - 35 U/L   Alkaline Phosphatase 113  39 - 117 U/L   Total Bilirubin 0.2 (*) 0.3 - 1.2 mg/dL   GFR calc non Af Amer >90  >90 mL/min   GFR calc Af Amer >90  >90 mL/min  POCT URINALYSIS DIP (DEVICE)   Collection Time    09/25/13 10:20 AM      Result Value Range   Glucose, UA 250 (*) NEGATIVE mg/dL   Bilirubin Urine NEGATIVE  NEGATIVE   Ketones, ur NEGATIVE  NEGATIVE mg/dL   Specific Gravity, Urine 1.020  1.005 - 1.030   Hgb urine dipstick SMALL (*) NEGATIVE   pH 7.0  5.0 - 8.0   Protein, ur 100 (*) NEGATIVE mg/dL   Urobilinogen, UA 0.2  0.0 - 1.0 mg/dL   Nitrite NEGATIVE  NEGATIVE   Leukocytes, UA NEGATIVE  NEGATIVE    Imaging Studies:    Nml [place] (see imaging)  Medications:  Scheduled . docusate sodium  100 mg Oral Daily  . glyBURIDE  2.5 mg Oral QPM  . glyBURIDE  5 mg Oral Q breakfast  . prenatal multivitamin  1 tablet Oral Q1200   I have reviewed the patient's current medications.  ASSESSMENT: Patient Active Problem List   Diagnosis Date Noted  . Preexisting diabetes complicating pregnancy in second trimester, antepartum 06/05/2013    Priority: High  . Supervision of high risk pregnancy in second trimester 06/05/2013    Priority: High  . Previous cesarean delivery, delivered, with or without mention of antepartum condition 06/05/2013  . History of chlamydia infection 01/31/2010  . TOBACCO USER 08/17/2009  . HYPERLIPIDEMIA 07/24/2008  . 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 DIABETES MELLITUS II, UNCOMPLICATED 01/27/2007  . OBESITY, NOS 01/27/2007    PLAN: Vickie Gonzales is a 27 y.o.  G3P1011 at [redacted]w[redacted]d  who is admitted for preeclampsia evaluation.  Pt also has DM.  1-Continue 24 hour urine and BP monitoring 2-Continue diabetic diet and meds; fasting is 93 today. 3-Anticipate D/C home this afternoon after 24 hour urine is complete.  If any signs of severe preeclampsia, will keep and deliver.  Honi Name H. 09/26/2013,6:42 AM

## 2013-09-26 NOTE — Discharge Summary (Signed)
Physician Discharge Summary  Patient ID: Vickie Gonzales MRN: [id number] DOB/AGE: March 22, 1986 27 y.o.  Admit date: 09/25/2013 Discharge date: 09/26/2013  Admission Diagnoses:34.[redacted] weeks gestation with preeclampsia and Class A2 diabetes  Discharge Diagnoses: same, mild preeclampsia Active Problems:   * No active hospital problems. *   Discharged Condition: fair  Hospital Course: Admitted for evaluation of preeclampsia, 24 hr urine collected  Consults: None  Significant Diagnostic Studies: labs: Pr/Cr 0.7, normal AST, ALT, platelets, AFI on [place]  Treatments:    Discharge Exam: Blood pressure 155/92, pulse 95, temperature 98.2 F (36.8 C), temperature source Oral, resp. rate 18, height 5\' 5"  (1.651 m), weight 206 lb (93.441 kg), last menstrual period 01/26/2013. General appearance: alert, cooperative and no distress CBG (last 3)   Recent Labs  09/26/13 1023 09/26/13 1450 09/26/13 1959  GLUCAP 100* 111* 124*    CMP     Component Value Date/Time   NA 136 09/25/2013 1225   K 3.3* 09/25/2013 1225   CL 103 09/25/2013 1225   CO2 23 09/25/2013 1225   GLUCOSE 141* 09/25/2013 1225   BUN 3* 09/25/2013 1225   CREATININE 0.51 09/25/2013 1225   CREATININE 0.50 06/26/2013 1102   CREATININE 0.50 06/26/2013 1102   CALCIUM 8.5 09/25/2013 1225   PROT 5.9* 09/25/2013 1225   ALBUMIN 2.5* 09/25/2013 1225   AST 15 09/25/2013 1225   ALT 11 09/25/2013 1225   ALKPHOS 113 09/25/2013 1225   BILITOT 0.2* 09/25/2013 1225   GFRNONAA >90 09/25/2013 1225   GFRAA >90 09/25/2013 1225   CBC    Component Value Date/Time   WBC 11.6* 09/25/2013 1225   RBC 3.55* 09/25/2013 1225   HGB 11.6* 09/25/2013 1225   HCT 33.2* 09/25/2013 1225   PLT 251 09/25/2013 1225   MCV 93.5 09/25/2013 1225   MCH 32.7 09/25/2013 1225   MCHC 34.9 09/25/2013 1225   RDW 13.8 09/25/2013 1225   LYMPHSABS 2.0 06/05/2013 1148   MONOABS 0.9 06/05/2013 1148   EOSABS 0.1 06/05/2013 1148   BASOSABS 0.0 06/05/2013 1148   24 hr  urine pending   Disposition: 01-Home or Self Care     Medication List         ACCU-CHEK FASTCLIX LANCETS Misc  1 each by Percutaneous route 4 (four) times daily.     ACCU-CHEK NANO SMARTVIEW W/DEVICE Kit  1 each by Does not apply route as directed. Check fasting, and two hours after breakfast, lunch and dinner     diphenhydrAMINE 25 MG tablet  Commonly known as:  BENADRYL  Take 1 tablet (25 mg total) by mouth at bedtime as needed for sleep.     glucose blood test strip  Commonly known as:  ACCU-CHEK SMARTVIEW  Use as instructed     glyBURIDE 2.5 MG tablet  Commonly known as:  DIABETA  Take 1 tablet (2.5 mg total) by mouth 2 (two) times daily with a meal.           Follow-up Information   Follow up with [facility]. Call on 09/28/2013.      Signed: [name] 09/26/2013, 10:45 PM

## 2013-09-28 ENCOUNTER — Ambulatory Visit (INDEPENDENT_AMBULATORY_CARE_PROVIDER_SITE_OTHER): Payer: Medicaid Other | Admitting: Obstetrics & Gynecology

## 2013-09-28 VITALS — BP 147/97 | Wt 202.3 lb

## 2013-09-28 DIAGNOSIS — E119 Type 2 diabetes mellitus without complications: Secondary | ICD-10-CM

## 2013-09-28 DIAGNOSIS — O24919 Unspecified diabetes mellitus in pregnancy, unspecified trimester: Secondary | ICD-10-CM

## 2013-09-28 LAB — POCT URINALYSIS DIP (DEVICE)
Ketones, ur: 15 mg/dL — AB
Nitrite: NEGATIVE
Protein, ur: 100 mg/dL — AB
Specific Gravity, Urine: 1.015 (ref 1.005–1.030)
pH: 6 (ref 5.0–8.0)

## 2013-09-28 NOTE — Progress Notes (Signed)
Pulse- 107 Patient denies headaches since recent hospital d/c

## 2013-09-28 NOTE — Patient Instructions (Signed)
Preeclampsia and Eclampsia  Preeclampsia is a condition of high blood pressure during pregnancy. It can happen at 20 weeks or later in pregnancy. If high blood pressure occurs in the second half of pregnancy with no other symptoms, it is called gestational hypertension and goes away after the baby is born. If any of the symptoms listed below develop with gestational hypertension, it is then called preeclampsia. Eclampsia (convulsions) may follow preeclampsia. This is one of the reasons for regular prenatal checkups. Early diagnosis and treatment are very important to prevent eclampsia.  CAUSES   There is no known cause of preeclampsia/eclampsia in pregnancy. There are several known conditions that may put the pregnant woman at risk, such as:  · The first pregnancy.  · Having preeclampsia in a past pregnancy.  · Having lasting (chronic) high blood pressure.  · Having multiples (twins, triplets).  · Being age 35 or older.  · African American ethnic background.  · Having kidney disease or diabetes.  · Medical conditions such as lupus or blood diseases.  · Being overweight (obese).  SYMPTOMS   · High blood pressure.  · Headaches.  · Sudden weight gain.  · Swelling of hands, face, legs, and feet.  · Protein in the urine.  · Feeling sick to your stomach (nauseous) and throwing up (vomiting).  · Vision problems (blurred or double vision).  · Numbness in the face, arms, legs, and feet.  · Dizziness.  · Slurred speech.  · Preeclampsia can cause growth retardation in the fetus.  · Separation (abruption) of the placenta.  · Not enough fluid in the amniotic sac (oligohydramnios).  · Sensitivity to bright lights.  · Belly (abdominal) pain.  DIAGNOSIS   If protein is found in the urine in the second half of pregnancy, this is considered preeclampsia. Other symptoms mentioned above may also be present.  TREATMENT   It is necessary to treat this.  · Your caregiver may prescribe bed rest early in this condition. Plenty of rest and  salt restriction may be all that is needed.  · Medicines may be necessary to lower blood pressure if the condition does not respond to more conservative measures.  · In more severe cases, hospitalization may be needed:  · For treatment of blood pressure.  · To control fluid retention.  · To monitor the baby to see if the condition is causing harm to the baby.  · Hospitalization is the best way to treat the first sign of preeclampsia. This is so the mother and baby can be watched closely and blood tests can be done effectively and correctly.  · If the condition becomes severe, it may be necessary to induce labor or to remove the infant by surgical means (cesarean section). The best cure for preeclampsia/eclampsia is to deliver the baby.  Preeclampsia and eclampsia involve risks to mother and infant. Your caregiver will discuss these risks with you. Together, you can work out the best possible approach to your problems. Make sure you keep your prenatal visits as scheduled. Not keeping appointments could result in a chronic or permanent injury, pain, disability to you, and death or injury to you or your unborn baby. If there is any problem keeping the appointment, you must call to reschedule.  HOME CARE INSTRUCTIONS   · Keep your prenatal appointments and tests as scheduled.  · Tell your caregiver if you have any of the above risk factors.  · Get plenty of rest and sleep.  · Eat a balanced   diet that is low in salt, and do not add salt to your food.  · Avoid stressful situations.  · Only take over-the-counter and prescriptions medicines for pain, discomfort, or fever as directed by your caregiver.  SEEK IMMEDIATE MEDICAL CARE IF:   · You develop severe swelling anywhere in the body. This usually occurs in the legs.  · You gain 5 lb/2.3 kg or more in a week.  · You develop a severe headache, dizziness, problems with your vision, or confusion.  · You have abdominal pain, nausea, or vomiting.  · You have a seizure.  · You  have trouble moving any part of your body, or you develop numbness or problems speaking.  · You have bruising or abnormal bleeding from anywhere in the body.  · You develop a stiff neck.  · You pass out.  MAKE SURE YOU:   · Understand these instructions.  · Will watch your condition.  · Will get help right away if you are not doing well or get worse.  Document Released: 11/13/2000 Document Revised: 02/08/2012 Document Reviewed: 06/29/2008  [employer]® Patient Information ©2014 [employer].

## 2013-09-28 NOTE — Progress Notes (Signed)
Was admitted and observed for preeclampsia this week, 24 hr urine protein 1200 mg, some heartburn but no headache. [place] FBS 105, had nl sugars in hospital. NST today reactive. Precautions given RTC Monday

## 2013-10-02 ENCOUNTER — Inpatient Hospital Stay (HOSPITAL_COMMUNITY): Payer: Medicaid Other

## 2013-10-02 ENCOUNTER — Inpatient Hospital Stay (HOSPITAL_COMMUNITY): Payer: Medicaid Other | Admitting: Anesthesiology

## 2013-10-02 ENCOUNTER — Encounter (HOSPITAL_COMMUNITY): Payer: Self-pay | Admitting: *Deleted

## 2013-10-02 ENCOUNTER — Encounter (HOSPITAL_COMMUNITY): Payer: Medicaid Other | Admitting: Anesthesiology

## 2013-10-02 ENCOUNTER — Inpatient Hospital Stay (HOSPITAL_COMMUNITY)
Admission: AD | Admit: 2013-10-02 | Discharge: 2013-10-06 | DRG: 765 | Disposition: A | Discharge status: Home / Self Care | Payer: Medicaid Other | Source: Ambulatory Visit | Attending: Obstetrics & Gynecology | Admitting: Obstetrics & Gynecology

## 2013-10-02 ENCOUNTER — Ambulatory Visit (INDEPENDENT_AMBULATORY_CARE_PROVIDER_SITE_OTHER): Payer: Medicaid Other | Admitting: Obstetrics & Gynecology

## 2013-10-02 VITALS — BP 148/106 | Wt 206.1 lb

## 2013-10-02 DIAGNOSIS — E119 Type 2 diabetes mellitus without complications: Secondary | ICD-10-CM | POA: Diagnosis present

## 2013-10-02 DIAGNOSIS — O4590 Premature separation of placenta, unspecified, unspecified trimester: Secondary | ICD-10-CM | POA: Diagnosis present

## 2013-10-02 DIAGNOSIS — O99334 Smoking (tobacco) complicating childbirth: Secondary | ICD-10-CM | POA: Diagnosis present

## 2013-10-02 DIAGNOSIS — O0992 Supervision of high risk pregnancy, unspecified, second trimester: Secondary | ICD-10-CM

## 2013-10-02 DIAGNOSIS — O2432 Unspecified pre-existing diabetes mellitus in childbirth: Secondary | ICD-10-CM | POA: Diagnosis present

## 2013-10-02 DIAGNOSIS — O99892 Other specified diseases and conditions complicating childbirth: Secondary | ICD-10-CM | POA: Diagnosis present

## 2013-10-02 DIAGNOSIS — Z98891 History of uterine scar from previous surgery: Secondary | ICD-10-CM

## 2013-10-02 DIAGNOSIS — O34219 Maternal care for unspecified type scar from previous cesarean delivery: Secondary | ICD-10-CM

## 2013-10-02 DIAGNOSIS — O24919 Unspecified diabetes mellitus in pregnancy, unspecified trimester: Secondary | ICD-10-CM

## 2013-10-02 DIAGNOSIS — O4693 Antepartum hemorrhage, unspecified, third trimester: Secondary | ICD-10-CM

## 2013-10-02 DIAGNOSIS — O24312 Unspecified pre-existing diabetes mellitus in pregnancy, second trimester: Secondary | ICD-10-CM

## 2013-10-02 DIAGNOSIS — O1493 Unspecified pre-eclampsia, third trimester: Secondary | ICD-10-CM

## 2013-10-02 DIAGNOSIS — O1400 Mild to moderate pre-eclampsia, unspecified trimester: Secondary | ICD-10-CM | POA: Insufficient documentation

## 2013-10-02 DIAGNOSIS — Z794 Long term (current) use of insulin: Secondary | ICD-10-CM

## 2013-10-02 DIAGNOSIS — IMO0002 Reserved for concepts with insufficient information to code with codable children: Secondary | ICD-10-CM

## 2013-10-02 DIAGNOSIS — O1403 Mild to moderate pre-eclampsia, third trimester: Secondary | ICD-10-CM

## 2013-10-02 DIAGNOSIS — O1414 Severe pre-eclampsia complicating childbirth: Principal | ICD-10-CM | POA: Diagnosis present

## 2013-10-02 DIAGNOSIS — Z22330 Carrier of Group B streptococcus: Secondary | ICD-10-CM

## 2013-10-02 HISTORY — DX: Gastro-esophageal reflux disease without esophagitis: K21.9

## 2013-10-02 LAB — COMPREHENSIVE METABOLIC PANEL
ALT: 10 U/L (ref 0–35)
AST: 16 U/L (ref 0–37)
Albumin: 2.7 g/dL — ABNORMAL LOW (ref 3.5–5.2)
Alkaline Phosphatase: 123 U/L — ABNORMAL HIGH (ref 39–117)
Alkaline Phosphatase: 135 U/L — ABNORMAL HIGH (ref 39–117)
CO2: 25 mEq/L (ref 19–32)
Calcium: 8.9 mg/dL (ref 8.4–10.5)
Chloride: 101 mEq/L (ref 96–112)
Creat: 0.52 mg/dL (ref 0.50–1.10)
Creatinine, Ser: 0.52 mg/dL (ref 0.50–1.10)
GFR calc Af Amer: >90 mL/min (ref >90)
GFR calc non Af Amer: >90 mL/min (ref >90)
Glucose, Bld: 116 mg/dL — ABNORMAL HIGH (ref 70–99)
Potassium: 3.6 mEq/L (ref 3.5–5.3)
Potassium: 3.5 mEq/L (ref 3.5–5.1)
Total Bilirubin: 0.4 mg/dL (ref 0.3–1.2)
Total Bilirubin: 0.3 mg/dL (ref 0.3–1.2)
Total Protein: 6.1 g/dL (ref 6.0–8.3)
Total Protein: 6.2 g/dL (ref 6.0–8.3)

## 2013-10-02 LAB — POCT URINALYSIS DIP (DEVICE)
Bilirubin Urine: NEGATIVE
Glucose, UA: 100 mg/dL — AB
Nitrite: NEGATIVE
Protein, ur: 100 mg/dL — AB
Specific Gravity, Urine: 1.01 (ref 1.005–1.030)
Urobilinogen, UA: 0.2 mg/dL (ref 0.0–1.0)

## 2013-10-02 LAB — CBC
Hemoglobin: 13.1 g/dL (ref 12.0–15.0)
MCH: 32.4 pg (ref 26.0–34.0)
MCHC: 34.5 g/dL (ref 30.0–36.0)
MCHC: 34.9 g/dL (ref 30.0–36.0)
MCV: 94.1 fL (ref 78.0–100.0)
MCV: 93.6 fL (ref 78.0–100.0)
Platelets: 263 10*3/uL (ref 150–400)
Platelets: 232 10*3/uL (ref 150–400)
RBC: 4.04 MIL/uL (ref 3.87–5.11)
RDW: 13.8 % (ref 11.5–15.5)
WBC: 17.8 10*3/uL — ABNORMAL HIGH (ref 4.0–10.5)

## 2013-10-02 MED ORDER — LABETALOL HCL 5 MG/ML IV SOLN: 10.0000 mg | INTRAVENOUS | Status: DC | PRN

## 2013-10-02 MED ORDER — FENTANYL CITRATE 0.05 MG/ML IJ SOLN
INTRAMUSCULAR | Status: AC
  Filled 2013-10-02: qty 2

## 2013-10-02 MED ORDER — CEFAZOLIN SODIUM-DEXTROSE 2-3 GM-% IV SOLR
INTRAVENOUS | Status: AC
  Filled 2013-10-02: qty 50

## 2013-10-02 MED ORDER — ONDANSETRON HCL 4 MG/2ML IJ SOLN
INTRAMUSCULAR | Status: AC
  Filled 2013-10-02: qty 2

## 2013-10-02 MED ORDER — EPHEDRINE 5 MG/ML INJ
INTRAVENOUS | Status: AC
  Filled 2013-10-02: qty 10

## 2013-10-02 MED ORDER — DEXTROSE 5 % IV SOLN
INTRAVENOUS | Status: AC
  Filled 2013-10-02: qty 3000

## 2013-10-02 MED ORDER — OXYTOCIN 10 UNIT/ML IJ SOLN
INTRAMUSCULAR | Status: AC
  Filled 2013-10-02: qty 4

## 2013-10-02 MED ORDER — METOCLOPRAMIDE HCL 5 MG/ML IJ SOLN
10.0000 mg | Freq: Once | INTRAMUSCULAR | Status: AC
  Administered 2013-10-02: 10 mg via INTRAVENOUS
  Filled 2013-10-02: qty 2

## 2013-10-02 MED ORDER — MORPHINE SULFATE 0.5 MG/ML IJ SOLN
INTRAMUSCULAR | Status: AC
  Filled 2013-10-02: qty 10

## 2013-10-02 MED ORDER — CEFAZOLIN SODIUM-DEXTROSE 2-3 GM-% IV SOLR
2.0000 g | INTRAVENOUS | Status: DC
  Filled 2013-10-02: qty 50

## 2013-10-02 MED ORDER — CITRIC ACID-SODIUM CITRATE 334-500 MG/5ML PO SOLN
30.0000 mL | Freq: Once | ORAL | Status: AC
  Administered 2013-10-02: 30 mL via ORAL
  Filled 2013-10-02: qty 15

## 2013-10-02 MED ORDER — GLYBURIDE 5 MG PO TABS: 5.0000 mg | ORAL_TABLET | Freq: Two times a day (BID) | ORAL | Status: DC

## 2013-10-02 MED ORDER — SODIUM CHLORIDE 0.9 % IV SOLN: INTRAVENOUS | Status: DC

## 2013-10-02 MED ORDER — FAMOTIDINE IN NACL 20-0.9 MG/50ML-% IV SOLN
20.0000 mg | Freq: Once | INTRAVENOUS | Status: AC
  Administered 2013-10-02: 20 mg via INTRAVENOUS
  Filled 2013-10-02: qty 50

## 2013-10-02 NOTE — Patient Instructions (Signed)

## 2013-10-02 NOTE — H&P (Signed)
HPI: Vickie Gonzales is a 27 y.o. year old G38P1011 female at [redacted]w[redacted]d weeks gestation by LMP who presents to [facility]U by EMS reporting gush of blood at 2200 followed by continuous low abd pain. Denies HA, vision changes, or epigastric pain. Prior C/S for [facility]. Know Pre-E. Last food and drink 2230.   Patient Active Problem List   Diagnosis Date Noted  . Preeclampsia 10/02/2013  . Preexisting diabetes complicating pregnancy in second trimester, antepartum 06/05/2013  . Previous cesarean delivery, delivered, with or without mention of antepartum condition 06/05/2013  . Supervision of high risk pregnancy in second trimester 06/05/2013  . History of chlamydia infection 01/31/2010  . TOBACCO USER 08/17/2009  . HYPERLIPIDEMIA 07/24/2008  . 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 DIABETES MELLITUS II, UNCOMPLICATED 01/27/2007  . OBESITY, NOS 01/27/2007  Limited PNC (lost to care x ~2 months.   Clinic HRC (Class C diabetes, hx IUGR)  Genetic Screen None  Anatomic [place] Normal (spine and outflow tracts not well visualized), LMP dating confirmed  Glucose Screen N/A - preexisting diabetes  GBS Pos urine  Feeding Preference Breast  Contraception  BTS consent signed 08/14/13  Circumcision   Pap 06/05/13    Maternal Medical History:  Reason for admission: Nausea.    OB History   Grav Para Term Preterm Abortions TAB SAB Ect Mult Living   3 1 1  1 1    1      Past Medical History  Diagnosis Date  . Hypertension   . Diabetes mellitus     On insulin during pregnancy otherwise oral meds  . GERD (gastroesophageal reflux disease)    Past Surgical History  Procedure Laterality Date  . Cesarean section     Family History: family history includes Cancer in her mother; Diabetes in her mother. Social History:  reports that she has been smoking Cigarettes.  She has been smoking about 0.25 packs per day. She has never used smokeless tobacco. She reports that she does not drink alcohol or use illicit  drugs.   Prenatal Transfer Tool  Maternal Diabetes: Yes:  Diabetes Type:  Pre-pregnancy Genetic Screening: Declined Maternal Ultrasounds/Referrals: Normal Fetal Ultrasounds or other Referrals:  Other:  Maternal Substance Abuse:  Yes:  Type: Smoker Significant Maternal Medications:  Meds include: Other:  Significant Maternal Lab Results:  Lab values include: Group B Strep positive, Other:  Other Comments:  Suspected abruption. Glyburide, Pre-E, Missed fetal Echo. (Lost to care x ~2 months)  Review of Systems  Constitutional: Negative for fever and chills.  Eyes: Negative for blurred vision and double vision.  Cardiovascular: Negative for chest pain.  Gastrointestinal: Positive for abdominal pain (constant). Negative for nausea and vomiting.  Neurological: Negative for headaches.    Dilation: 1 Effacement (%): Thick Exam by:: Ivonne Andrew CNM Blood pressure 139/101, pulse 113, last menstrual period 01/26/2013. Maternal Exam:  Uterine Assessment: Unsure. Palpating firm continuously.  Abdomen: Surgical scars: low transverse.   Fundal height is S=D.   Fetal presentation: vertex  Introitus: Normal vulva. Vulva is negative for lesion.  Pelvis: adequate for delivery.   Large amount of BRB w/ large clots in vagina. Large amount of blood on EMS sheets.  Cervix: Cervix evaluated by sterile speculum exam and digital exam.     Fetal Exam Fetal Monitor Review: Mode: ultrasound.   Baseline rate: 120.  Variability: minimal (<5 bpm).   Pattern: no decelerations and late decelerations.   Few late decels.  Fetal State Assessment: Category III - tracings are abnormal.  Physical Exam  Nursing note and vitals reviewed. Constitutional: She is oriented to person, place, and time. She appears well-developed and well-nourished. She appears distressed.  HENT:  Head: Normocephalic.  Eyes: Conjunctivae are normal. Pupils are equal, round, and reactive to light.  Cardiovascular: Normal rate,  regular rhythm and normal heart sounds.   Respiratory: Effort normal and breath sounds normal.  GI: There is no tenderness.  Firm  Genitourinary: Vulva exhibits no lesion.  Large amount of BRB w/ clots.   Musculoskeletal: She exhibits edema. She exhibits no tenderness.  Neurological: She is alert and oriented to person, place, and time. She has normal reflexes.  Skin: Skin is warm and dry.  Psychiatric: She has a normal mood and affect.    Prenatal labs: ABO, Rh: --/--/O POS, O POS (10/27 2112) Antibody: NEG (10/27 2112) Rubella: 0.60 (07/07 1148) RPR: NON REAC (07/07 1148)  HBsAg: NEGATIVE (07/07 1148)  HIV: NON REACTIVE (07/07 1148)  GBS:   Pos urine  Assessment: 1. Labor: none 2. Fetal Wellbeing: Category II-III  3. Pain Control: none 4. GBS: pos in urine 5. 35.4 week IUP  Plan:  1. Prep OR per Dr. Marice Potter. 2. Routine OR orders 3. PIH labs, T & S. UDS 4. Declines BTL.   [place], CNM 10/02/2013 11:43 PM

## 2013-10-02 NOTE — Progress Notes (Signed)
Pulse: 102

## 2013-10-02 NOTE — Progress Notes (Signed)
Did not bring log book, emphasized that we need the records to ensure adequate glycemic control. Reports fasting today was 111; increased Glyburide to 5 mg po bid. Will reevaluate next week . Still elevated BP, preeclampsia without severe features.  Labs to be rechecked today, were normal on 09/25/13.  10/27 EFW 26%.  NST performed today was reviewed and was found to be reactive.  Continue recommended antenatal testing and prenatal care.  No other complaints or concerns.  Preeclampsia, fetal movement and labor precautions reviewed.

## 2013-10-02 NOTE — Anesthesia Preprocedure Evaluation (Addendum)
Anesthesia Evaluation  Patient identified by MRN, date of birth, ID band Patient awake    Reviewed: Allergy & Precautions, H&P , NPO status , Patient's Chart, lab work & pertinent test results, reviewed documented beta blocker date and time   History of Anesthesia Complications Negative for: history of anesthetic complications  Airway Mallampati: II TM Distance: >3 FB Neck ROM: full    Dental  (+) Teeth Intact   Pulmonary Current Smoker (1/2 ppd),  breath sounds clear to auscultation        Cardiovascular hypertension (severe preeclampsia - left AMA 1 week ago), Rhythm:regular Rate:Normal     Neuro/Psych negative neurological ROS  negative psych ROS   GI/Hepatic Neg liver ROS, GERD- (prn otc meds)  ,  Endo/Other  diabetes, Type 2, Oral Hypoglycemic AgentsObese 34.4  Renal/GU negative Renal ROS  negative genitourinary   Musculoskeletal   Abdominal   Peds  Hematology negative hematology ROS (+)   Anesthesia Other Findings   Reproductive/Obstetrics (+) Pregnancy (h/o c/s x1, now with severe preeclampsia and abruption for repeat c/s)                           Anesthesia Physical Anesthesia Plan  ASA: III and emergent  Anesthesia Plan: Spinal   Post-op Pain Management:    Induction:   Airway Management Planned:   Additional Equipment:   Intra-op Plan:   Post-operative Plan:   Informed Consent: I have reviewed the patients History and Physical, chart, labs and discussed the procedure including the risks, benefits and alternatives for the proposed anesthesia with the patient or authorized representative who has indicated his/her understanding and acceptance.     Plan Discussed with: Surgeon and CRNA  Anesthesia Plan Comments:        Anesthesia Quick Evaluation

## 2013-10-03 ENCOUNTER — Encounter (HOSPITAL_COMMUNITY)
Admission: AD | Disposition: A | Discharge status: Home / Self Care | Payer: Self-pay | Source: Ambulatory Visit | Attending: Obstetrics & Gynecology

## 2013-10-03 ENCOUNTER — Encounter (HOSPITAL_COMMUNITY): Payer: Self-pay | Admitting: Anesthesiology

## 2013-10-03 DIAGNOSIS — O4590 Premature separation of placenta, unspecified, unspecified trimester: Secondary | ICD-10-CM

## 2013-10-03 DIAGNOSIS — O2432 Unspecified pre-existing diabetes mellitus in childbirth: Secondary | ICD-10-CM

## 2013-10-03 DIAGNOSIS — O1414 Severe pre-eclampsia complicating childbirth: Secondary | ICD-10-CM

## 2013-10-03 LAB — RAPID URINE DRUG SCREEN, HOSP PERFORMED
Barbiturates: NOT DETECTED
Benzodiazepines: NOT DETECTED
Cocaine: NOT DETECTED
Opiates: NOT DETECTED

## 2013-10-03 LAB — CBC
Hemoglobin: 9.5 g/dL — ABNORMAL LOW (ref 12.0–15.0)
MCV: 93.4 fL (ref 78.0–100.0)
Platelets: 189 10*3/uL (ref 150–400)
RDW: 13.9 % (ref 11.5–15.5)
WBC: 17.5 10*3/uL — ABNORMAL HIGH (ref 4.0–10.5)

## 2013-10-03 LAB — GLUCOSE, CAPILLARY
Glucose-Capillary: 219 mg/dL — ABNORMAL HIGH (ref 70–99)
Glucose-Capillary: 226 mg/dL — ABNORMAL HIGH (ref 70–99)
Glucose-Capillary: 118 mg/dL — ABNORMAL HIGH (ref 70–99)
Glucose-Capillary: 169 mg/dL — ABNORMAL HIGH (ref 70–99)

## 2013-10-03 LAB — RAPID HIV SCREEN (WH-MAU): Rapid HIV Screen: NONREACTIVE

## 2013-10-03 LAB — TYPE AND SCREEN
ABO/RH(D): O POS
Antibody Screen: NEGATIVE

## 2013-10-03 SURGERY — Surgical Case
Anesthesia: Spinal | Site: Abdomen | Wound class: Clean Contaminated

## 2013-10-03 MED ORDER — ZOLPIDEM TARTRATE 5 MG PO TABS
5.0000 mg | ORAL_TABLET | Freq: Every evening | ORAL | Status: DC | PRN
  Administered 2013-10-05: 5 mg via ORAL
  Filled 2013-10-03: qty 1

## 2013-10-03 MED ORDER — SIMETHICONE 80 MG PO CHEW: 80.0000 mg | CHEWABLE_TABLET | ORAL | Status: DC

## 2013-10-03 MED ORDER — DIPHENHYDRAMINE HCL 25 MG PO CAPS: 25.0000 mg | ORAL_CAPSULE | Freq: Four times a day (QID) | ORAL | Status: DC | PRN

## 2013-10-03 MED ORDER — LACTATED RINGERS IV SOLN: INTRAVENOUS | Status: DC

## 2013-10-03 MED ORDER — KETOROLAC TROMETHAMINE 60 MG/2ML IM SOLN
INTRAMUSCULAR | Status: AC
  Filled 2013-10-03: qty 2

## 2013-10-03 MED ORDER — ONDANSETRON HCL 4 MG/2ML IJ SOLN: 4.0000 mg | INTRAMUSCULAR | Status: DC | PRN

## 2013-10-03 MED ORDER — DIPHENHYDRAMINE HCL 25 MG PO CAPS: 25.0000 mg | ORAL_CAPSULE | ORAL | Status: DC | PRN

## 2013-10-03 MED ORDER — SIMETHICONE 80 MG PO CHEW: 80.0000 mg | CHEWABLE_TABLET | ORAL | Status: DC | PRN

## 2013-10-03 MED ORDER — OXYTOCIN 10 UNIT/ML IJ SOLN
INTRAMUSCULAR | Status: AC
  Filled 2013-10-03: qty 4

## 2013-10-03 MED ORDER — FENTANYL CITRATE 0.05 MG/ML IJ SOLN: 25.0000 ug | INTRAMUSCULAR | Status: DC | PRN

## 2013-10-03 MED ORDER — MAGNESIUM SULFATE BOLUS VIA INFUSION
4.0000 g | Freq: Once | INTRAVENOUS | Status: AC
  Administered 2013-10-03: 4 g via INTRAVENOUS
  Filled 2013-10-03: qty 500

## 2013-10-03 MED ORDER — FENTANYL CITRATE 0.05 MG/ML IJ SOLN
INTRAMUSCULAR | Status: DC | PRN
  Administered 2013-10-03: 15 ug via INTRATHECAL

## 2013-10-03 MED ORDER — GLYBURIDE 2.5 MG PO TABS
2.5000 mg | ORAL_TABLET | Freq: Every day | ORAL | Status: DC
  Filled 2013-10-03: qty 1

## 2013-10-03 MED ORDER — BISACODYL 10 MG RE SUPP
10.0000 mg | Freq: Every day | RECTAL | Status: DC | PRN
  Filled 2013-10-03: qty 1

## 2013-10-03 MED ORDER — LACTATED RINGERS IV SOLN
INTRAVENOUS | Status: DC | PRN
  Administered 2013-10-02 – 2013-10-03 (×3): via INTRAVENOUS

## 2013-10-03 MED ORDER — PRENATAL MULTIVITAMIN CH
1.0000 | ORAL_TABLET | Freq: Every day | ORAL | Status: DC
  Administered 2013-10-03 – 2013-10-05 (×3): 1 via ORAL
  Filled 2013-10-03 (×3): qty 1

## 2013-10-03 MED ORDER — KETOROLAC TROMETHAMINE 60 MG/2ML IM SOLN
60.0000 mg | Freq: Once | INTRAMUSCULAR | Status: AC | PRN
  Administered 2013-10-03: 60 mg via INTRAMUSCULAR

## 2013-10-03 MED ORDER — METOCLOPRAMIDE HCL 5 MG/ML IJ SOLN: 10.0000 mg | Freq: Three times a day (TID) | INTRAMUSCULAR | Status: DC | PRN

## 2013-10-03 MED ORDER — DIPHENHYDRAMINE HCL 50 MG/ML IJ SOLN: 25.0000 mg | INTRAMUSCULAR | Status: DC | PRN

## 2013-10-03 MED ORDER — SCOPOLAMINE 1 MG/3DAYS TD PT72
1.0000 | MEDICATED_PATCH | Freq: Once | TRANSDERMAL | Status: AC
  Administered 2013-10-03: 1.5 mg via TRANSDERMAL

## 2013-10-03 MED ORDER — BUPIVACAINE IN DEXTROSE 0.75-8.25 % IT SOLN
INTRATHECAL | Status: DC | PRN
  Administered 2013-10-03: 1.5 mL via INTRATHECAL

## 2013-10-03 MED ORDER — BUPIVACAINE HCL (PF) 0.5 % IJ SOLN
INTRAMUSCULAR | Status: DC | PRN
  Administered 2013-10-03: 30 mL

## 2013-10-03 MED ORDER — KETOROLAC TROMETHAMINE 30 MG/ML IJ SOLN: 30.0000 mg | Freq: Four times a day (QID) | INTRAMUSCULAR | Status: AC | PRN

## 2013-10-03 MED ORDER — OXYTOCIN 40 UNITS IN LACTATED RINGERS INFUSION - SIMPLE MED
62.5000 mL/h | INTRAVENOUS | Status: AC
  Administered 2013-10-03: 62.5 mL/h via INTRAVENOUS

## 2013-10-03 MED ORDER — LACTATED RINGERS IV SOLN
INTRAVENOUS | Status: DC | PRN
  Administered 2013-10-03: via INTRAVENOUS

## 2013-10-03 MED ORDER — TETANUS-DIPHTH-ACELL PERTUSSIS 5-2.5-18.5 LF-MCG/0.5 IM SUSP
0.5000 mL | Freq: Once | INTRAMUSCULAR | Status: AC
  Administered 2013-10-04: 0.5 mL via INTRAMUSCULAR
  Filled 2013-10-03 (×2): qty 0.5

## 2013-10-03 MED ORDER — SIMETHICONE 80 MG PO CHEW
80.0000 mg | CHEWABLE_TABLET | Freq: Three times a day (TID) | ORAL | Status: DC
  Administered 2013-10-03 – 2013-10-06 (×12): 80 mg via ORAL
  Filled 2013-10-03 (×13): qty 1

## 2013-10-03 MED ORDER — GLYBURIDE 2.5 MG PO TABS
2.5000 mg | ORAL_TABLET | Freq: Every day | ORAL | Status: DC
  Administered 2013-10-04 – 2013-10-06 (×3): 2.5 mg via ORAL
  Filled 2013-10-03 (×4): qty 1

## 2013-10-03 MED ORDER — OXYCODONE-ACETAMINOPHEN 5-325 MG PO TABS
1.0000 | ORAL_TABLET | ORAL | Status: DC | PRN
  Administered 2013-10-04 – 2013-10-05 (×2): 2 via ORAL
  Filled 2013-10-03 (×2): qty 2

## 2013-10-03 MED ORDER — NALBUPHINE HCL 10 MG/ML IJ SOLN
5.0000 mg | INTRAMUSCULAR | Status: DC | PRN
  Filled 2013-10-03: qty 1

## 2013-10-03 MED ORDER — OXYTOCIN 40 UNITS IN LACTATED RINGERS INFUSION - SIMPLE MED
INTRAVENOUS | Status: AC
  Administered 2013-10-03: 62.5 mL/h via INTRAVENOUS
  Filled 2013-10-03: qty 1000

## 2013-10-03 MED ORDER — PHENYLEPHRINE 8 MG IN D5W 100 ML (0.08MG/ML) PREMIX OPTIME
INJECTION | INTRAVENOUS | Status: DC | PRN
  Administered 2013-10-03: 60 ug/min via INTRAVENOUS

## 2013-10-03 MED ORDER — MAGNESIUM SULFATE 40 G IN LACTATED RINGERS - SIMPLE
2.0000 g/h | INTRAVENOUS | Status: AC
  Administered 2013-10-03: 2 g/h via INTRAVENOUS
  Filled 2013-10-03 (×2): qty 500

## 2013-10-03 MED ORDER — ONDANSETRON HCL 4 MG/2ML IJ SOLN: 4.0000 mg | Freq: Three times a day (TID) | INTRAMUSCULAR | Status: DC | PRN

## 2013-10-03 MED ORDER — LANOLIN HYDROUS EX OINT: 1.0000 "application " | TOPICAL_OINTMENT | CUTANEOUS | Status: DC | PRN

## 2013-10-03 MED ORDER — ONDANSETRON HCL 4 MG/2ML IJ SOLN
INTRAMUSCULAR | Status: AC
  Filled 2013-10-03: qty 2

## 2013-10-03 MED ORDER — MORPHINE SULFATE (PF) 0.5 MG/ML IJ SOLN
INTRAMUSCULAR | Status: DC | PRN
  Administered 2013-10-03: .1 mg via INTRATHECAL

## 2013-10-03 MED ORDER — CEFAZOLIN SODIUM-DEXTROSE 2-3 GM-% IV SOLR
INTRAVENOUS | Status: AC
  Filled 2013-10-03: qty 50

## 2013-10-03 MED ORDER — MEPERIDINE HCL 25 MG/ML IJ SOLN
INTRAMUSCULAR | Status: DC | PRN
  Administered 2013-10-03 (×2): 12.5 mg via INTRAVENOUS

## 2013-10-03 MED ORDER — SENNOSIDES-DOCUSATE SODIUM 8.6-50 MG PO TABS
2.0000 | ORAL_TABLET | ORAL | Status: DC
  Administered 2013-10-04 – 2013-10-05 (×4): 2 via ORAL
  Filled 2013-10-03 (×6): qty 2

## 2013-10-03 MED ORDER — NALOXONE HCL 1 MG/ML IJ SOLN
1.0000 ug/kg/h | INTRAVENOUS | Status: DC | PRN
  Filled 2013-10-03: qty 2

## 2013-10-03 MED ORDER — MEPERIDINE HCL 25 MG/ML IJ SOLN: 6.2500 mg | INTRAMUSCULAR | Status: DC | PRN

## 2013-10-03 MED ORDER — IBUPROFEN 600 MG PO TABS
600.0000 mg | ORAL_TABLET | Freq: Four times a day (QID) | ORAL | Status: DC
  Administered 2013-10-03 – 2013-10-06 (×13): 600 mg via ORAL
  Filled 2013-10-03 (×13): qty 1

## 2013-10-03 MED ORDER — BUPIVACAINE HCL (PF) 0.5 % IJ SOLN
INTRAMUSCULAR | Status: AC
  Filled 2013-10-03: qty 30

## 2013-10-03 MED ORDER — DIBUCAINE 1 % RE OINT
1.0000 "application " | TOPICAL_OINTMENT | RECTAL | Status: DC | PRN
  Filled 2013-10-03: qty 28

## 2013-10-03 MED ORDER — SODIUM CHLORIDE 0.9 % IJ SOLN: 3.0000 mL | INTRAMUSCULAR | Status: DC | PRN

## 2013-10-03 MED ORDER — MEPERIDINE HCL 25 MG/ML IJ SOLN
INTRAMUSCULAR | Status: AC
  Filled 2013-10-03: qty 1

## 2013-10-03 MED ORDER — LACTATED RINGERS IV SOLN
INTRAVENOUS | Status: DC
  Administered 2013-10-03: 100 mL/h via INTRAVENOUS

## 2013-10-03 MED ORDER — NALBUPHINE HCL 10 MG/ML IJ SOLN
5.0000 mg | INTRAMUSCULAR | Status: DC | PRN
Start: 2013-10-03 — End: 2013-10-06
  Filled 2013-10-03: qty 1

## 2013-10-03 MED ORDER — ONDANSETRON HCL 4 MG/2ML IJ SOLN
INTRAMUSCULAR | Status: DC | PRN
  Administered 2013-10-03: 4 mg via INTRAVENOUS

## 2013-10-03 MED ORDER — SCOPOLAMINE 1 MG/3DAYS TD PT72
MEDICATED_PATCH | TRANSDERMAL | Status: AC
  Filled 2013-10-03: qty 1

## 2013-10-03 MED ORDER — WITCH HAZEL-GLYCERIN EX PADS: 1.0000 "application " | MEDICATED_PAD | CUTANEOUS | Status: DC | PRN

## 2013-10-03 MED ORDER — FLEET ENEMA 7-19 GM/118ML RE ENEM: 1.0000 | ENEMA | Freq: Every day | RECTAL | Status: DC | PRN

## 2013-10-03 MED ORDER — DIPHENHYDRAMINE HCL 50 MG/ML IJ SOLN: 12.5000 mg | INTRAMUSCULAR | Status: DC | PRN

## 2013-10-03 MED ORDER — GLYBURIDE 2.5 MG PO TABS
2.5000 mg | ORAL_TABLET | Freq: Every day | ORAL | Status: DC
  Administered 2013-10-03 – 2013-10-05 (×3): 2.5 mg via ORAL
  Filled 2013-10-03 (×3): qty 1

## 2013-10-03 MED ORDER — NALOXONE HCL 0.4 MG/ML IJ SOLN: 0.4000 mg | INTRAMUSCULAR | Status: DC | PRN

## 2013-10-03 MED ORDER — ONDANSETRON HCL 4 MG PO TABS: 4.0000 mg | ORAL_TABLET | ORAL | Status: DC | PRN

## 2013-10-03 MED ORDER — OXYTOCIN 10 UNIT/ML IJ SOLN
INTRAMUSCULAR | Status: DC | PRN
  Administered 2013-10-03: 40 [IU] via INTRAMUSCULAR

## 2013-10-03 MED ORDER — LABETALOL HCL 5 MG/ML IV SOLN: 10.0000 mg | INTRAVENOUS | Status: DC | PRN

## 2013-10-03 MED ORDER — MENTHOL 3 MG MT LOZG
1.0000 | LOZENGE | OROMUCOSAL | Status: DC | PRN
  Filled 2013-10-03: qty 9

## 2013-10-03 SURGICAL SUPPLY — 40 items
APL SKNCLS STERI-STRIP NONHPOA (GAUZE/BANDAGES/DRESSINGS) ×1
BARRIER ADHS 3X4 INTERCEED (GAUZE/BANDAGES/DRESSINGS) IMPLANT
BENZOIN TINCTURE PRP APPL 2/3 (GAUZE/BANDAGES/DRESSINGS) ×2 IMPLANT
BRR ADH 4X3 ABS CNTRL BYND (GAUZE/BANDAGES/DRESSINGS)
CLAMP CORD UMBIL (MISCELLANEOUS) IMPLANT
CLOTH BEACON ORANGE TIMEOUT ST (SAFETY) ×2 IMPLANT
CONTAINER PREFILL 10% NBF 15ML (MISCELLANEOUS) IMPLANT
DRAPE LG THREE QUARTER DISP (DRAPES) ×4 IMPLANT
DRSG OPSITE POSTOP 4X10 (GAUZE/BANDAGES/DRESSINGS) ×2 IMPLANT
DURAPREP 26ML APPLICATOR (WOUND CARE) ×2 IMPLANT
ELECT REM PT RETURN 9FT ADLT (ELECTROSURGICAL) ×2
ELECTRODE REM PT RTRN 9FT ADLT (ELECTROSURGICAL) ×1 IMPLANT
GLOVE BIO SURGEON STRL SZ 6.5 (GLOVE) ×2 IMPLANT
GOWN PREVENTION PLUS XLARGE (GOWN DISPOSABLE) ×4 IMPLANT
GOWN STRL REIN XL XLG (GOWN DISPOSABLE) ×4 IMPLANT
KIT ABG SYR 3ML LUER SLIP (SYRINGE) IMPLANT
NEEDLE HYPO 25X5/8 SAFETYGLIDE (NEEDLE) IMPLANT
NEEDLE SPNL 18GX3.5 QUINCKE PK (NEEDLE) ×2 IMPLANT
NS IRRIG 1000ML POUR BTL (IV SOLUTION) ×2 IMPLANT
PACK C SECTION WH (CUSTOM PROCEDURE TRAY) ×2 IMPLANT
PAD ABD 7.5X8 STRL (GAUZE/BANDAGES/DRESSINGS) ×1 IMPLANT
PAD OB MATERNITY 4.3X12.25 (PERSONAL CARE ITEMS) ×2 IMPLANT
STRIP CLOSURE SKIN 1/2X4 (GAUZE/BANDAGES/DRESSINGS) ×2 IMPLANT
SUT PDS AB 0 CTX 60 (SUTURE) ×2 IMPLANT
SUT VIC AB 0 CT1 27 (SUTURE)
SUT VIC AB 0 CT1 27XBRD ANBCTR (SUTURE) IMPLANT
SUT VIC AB 0 CT1 36 (SUTURE) IMPLANT
SUT VIC AB 2-0 CT1 27 (SUTURE) ×2
SUT VIC AB 2-0 CT1 TAPERPNT 27 (SUTURE) ×1 IMPLANT
SUT VIC AB 2-0 CTX 36 (SUTURE) ×4 IMPLANT
SUT VIC AB 3-0 CT1 27 (SUTURE) ×4
SUT VIC AB 3-0 CT1 TAPERPNT 27 (SUTURE) ×2 IMPLANT
SUT VIC AB 3-0 SH 27 (SUTURE)
SUT VIC AB 3-0 SH 27X BRD (SUTURE) IMPLANT
SUT VIC AB 4-0 KS 27 (SUTURE) ×2 IMPLANT
SYR 30ML LL (SYRINGE) ×2 IMPLANT
TAPE CLOTH SURG 4X10 WHT LF (GAUZE/BANDAGES/DRESSINGS) ×1 IMPLANT
TOWEL OR 17X24 6PK STRL BLUE (TOWEL DISPOSABLE) ×2 IMPLANT
TRAY FOLEY CATH 14FR (SET/KITS/TRAYS/PACK) ×2 IMPLANT
WATER STERILE IRR 1000ML POUR (IV SOLUTION) ×2 IMPLANT

## 2013-10-03 NOTE — Anesthesia Postprocedure Evaluation (Signed)
  Anesthesia Post-op Note  Anesthesia Post Note  Patient: Vickie Gonzales  Procedure(s) Performed: Procedure(s) (LRB): CESAREAN SECTION (N/A)  Anesthesia type: Spinal  Patient location: PACU  Post pain: Pain level controlled  Post assessment: Post-op Vital signs reviewed  Last Vitals:  Filed Vitals:   10/03/13 0440  BP: 134/53  Pulse: 95  Temp: 36.9 C  Resp: 16    Post vital signs: Reviewed  Level of consciousness: awake  Complications: No apparent anesthesia complications

## 2013-10-03 NOTE — Op Note (Signed)
Vickie Gonzales PROCEDURE DATE: 10/02/2013 - 10/03/2013  PREOPERATIVE DIAGNOSES: Intrauterine pregnancy at  [redacted]w[redacted]d weeks gestation; abruptio placenta and severe preeclampsia  POSTOPERATIVE DIAGNOSES: The same  PROCEDURE: Repeat Low Transverse Cesarean Section  SURGEON:  Dr. Marice Potter  ASSISTANT:  Dr. Jolyn Lent    INDICATIONS: Vickie Gonzales is a 27 y.o. [id number] at [redacted]w[redacted]d here for cesarean section secondary to the indications listed under preoperative diagnoses; please see preoperative note for further details.  The risks of cesarean section were discussed with the patient including but were not limited to: bleeding which may require transfusion or reoperation; infection which may require antibiotics; injury to bowel, bladder, ureters or other surrounding organs; injury to the fetus; need for additional procedures including hysterectomy in the event of a life-threatening hemorrhage; placental abnormalities wth subsequent pregnancies, incisional problems, thromboembolic phenomenon and other postoperative/anesthesia complications.   The patient concurred with the proposed plan, giving informed written consent for the procedure.    FINDINGS:  Viable female infant in cephalic presentation.  Apgars pending. Infant was intubated at time leaving OR with NICU.  Clear amniotic fluid.  Intact placenta, three vessel cord.  Normal uterus, fallopian tubes and ovaries bilaterally.  ANESTHESIA: Spinal INTRAVENOUS FLUIDS: 2100 ml ESTIMATED BLOOD LOSS: 750 ml URINE OUTPUT:  75 ml SPECIMENS: Placenta sent to pathology COMPLICATIONS: None immediate  PROCEDURE IN DETAIL:  The patient preoperatively received intravenous antibiotics and had sequential compression devices applied to her lower extremities.  She was then taken to the operating room where spinal anesthesia was administered and was found to be adequate. She was then placed in a dorsal supine position with a leftward tilt, and prepped and draped in a sterile  manner.  A foley catheter was placed into her bladder and attached to constant gravity.  After an adequate timeout was performed, 30 ml of 0.5% Marcaine was injected subcutaneously around the prior scar. A Pfannenstiel skin incision was made with scalpel over prior scar and carried through to the underlying layer of fascia. The fascia was incised in the midline, and this incision was extended bilaterally using the Mayo scissors. The rectus muscles were separated in the midline medially with electrocaudary and the peritoneum was entered bluntly. Attention was turned to the lower uterine segment where a low transverse hysterotomy was made with a scalpel and extended bilaterally bluntly. Significant clot was appreciated outside of the amniotic sac.  The infant was successfully delivered, the cord was clamped and cut and the infant was handed over to awaiting neonatology team. Cord gasses collected. Uterine massage was then administered, and the placenta delivered intact with a three-vessel cord. The uterus was then cleared of clot and debris.  The hysterotomy was closed with 0 Vicryl in a running locked fashion, and an imbricating layer was also placed with 0 Vicryl. The pelvis was cleared of all clot and debris. Hemostasis was confirmed on all surfaces.  The fascia was then closed using 0 PDS in a running fashion.  The subcutaneous layer was irrigated.  The skin was closed with a 4-0 Vicryl subcuticular stitch. The patient tolerated the procedure well. Sponge, lap, instrument and needle counts were correct x 2.  She was taken to the recovery room in stable condition.   Tawana Scale, MD OB Fellow

## 2013-10-03 NOTE — Transfer of Care (Signed)
Immediate Anesthesia Transfer of Care Note  Patient: Vickie Gonzales  Procedure(s) Performed: Procedure(s): CESAREAN SECTION (N/A)  Patient Location: PACU  Anesthesia Type:Spinal  Level of Consciousness: awake, alert , oriented and patient cooperative  Airway & Oxygen Therapy: Patient Spontanous Breathing  Post-op Assessment: Report given to PACU RN and Post -op Vital signs reviewed and stable  Post vital signs: Reviewed and stable  Complications: No apparent anesthesia complications

## 2013-10-03 NOTE — Progress Notes (Signed)
Pt was taken to NICU via wheelchair by Wilford Sports, RN.

## 2013-10-04 ENCOUNTER — Encounter (HOSPITAL_COMMUNITY): Payer: Self-pay | Admitting: Obstetrics & Gynecology

## 2013-10-04 LAB — GLUCOSE, CAPILLARY
Glucose-Capillary: 80 mg/dL (ref 70–99)
Glucose-Capillary: 132 mg/dL — ABNORMAL HIGH (ref 70–99)
Glucose-Capillary: 73 mg/dL (ref 70–99)

## 2013-10-04 NOTE — Progress Notes (Signed)
Ur chart review completed.  

## 2013-10-04 NOTE — Progress Notes (Signed)
Agree with student note, saw patient, ready to transfer to [facility].  Adam Phenix, MD

## 2013-10-04 NOTE — Progress Notes (Signed)
10/04/13 1300  Clinical Encounter Type  Visited With Patient not available   Have been following from a distance since pt on BS; attempted visit today, but pt not in room.  Spiritual Care will continue to follow.  [address] [place], [place] [phone]

## 2013-10-04 NOTE — Progress Notes (Signed)
Post Partum Day 1 Subjective: up ad lib, voiding, tolerating PO, + flatus and lower abdominal soreness  Vickie Gonzales is a 27 yo G3P112 with known pre-eclampsia and pre-gestational diabetes who presented at [redacted]w[redacted]d to the MAU on 11/3 with placental abruption.  Pt taken to OR for RLTCS.  EBL 750 mL with no immediate complications. Baby to NICU.  Pt started on Mag sulfate 2g/hr on 11/4, stopped at 5 am on 11/5.  Increased Glyburide to 5mg  BID. CBG 169 8 pm 11/4.   Vickie Gonzales states that she is having moderate lower abdominal soreness around her incision site.  The pain is intermittent, and worsens with movement.   It is rated as 6-7/10 with movement ans 1-2/10 with rest.  Vickie Gonzales denies any bleeding or drainage of fluid from her incision site. She has been ambulating and urinating per usual, she has passed flatus but no BM yet. Her appetite has returned to normal as well.  She does report mild swelling of both feet, but denies any leg or calf pain.  She denies HA, CP, SOB, N/V, diarrhea, fever or chills. She is bottle feeding, undecided about circumcision, and wants to use OCPs for contraception.    Objective: Blood pressure 140/82, pulse 96, temperature 97.5 F (36.4 C), temperature source Oral, resp. rate 18, weight 93.486 kg (206 lb 1.6 oz), last menstrual period 01/26/2013, SpO2 95.00%, Bottle feeding Physical Exam:  General: alert, cooperative and mild distress Lochia: N/A Uterine Fundus: firm Incision: no significant drainage, no significant erythema DVT Evaluation: No evidence of DVT seen on physical exam. Negative Homan's sign. No cords or calf tenderness. No significant calf/ankle edema.   Recent Labs  10/02/13 2220 10/03/13 0645  HGB 11.8* 9.5*  HCT 33.8* 27.0*    Assessment/Plan: Contraception OCPs   LOS: 2 days   [name] 10/04/2013, 6:20 AM

## 2013-10-05 ENCOUNTER — Other Ambulatory Visit: Payer: Medicaid Other

## 2013-10-05 LAB — GLUCOSE, CAPILLARY: Glucose-Capillary: 80 mg/dL (ref 70–99)

## 2013-10-05 NOTE — Progress Notes (Signed)
Accidentally enterred 2 op notes. Second note incorrectly states primary c-section. Unable to update or edit.  Tawana Scale, MD OB Fellow

## 2013-10-05 NOTE — Op Note (Signed)
Vickie Gonzales PROCEDURE DATE: 10/02/2013 - 10/03/2013  PREOPERATIVE DIAGNOSES: Intrauterine pregnancy at  [redacted]w[redacted]d weeks gestation; abruptio placenta  POSTOPERATIVE DIAGNOSES: The same  PROCEDURE: Primary Low Transverse Cesarean Section  SURGEON:  Dr. Marice Potter   ASSISTANT:  Dr. Ike Bene    INDICATIONS: Vickie Gonzales is a 27 y.o. [phone] at [redacted]w[redacted]d here for cesarean section secondary to the indications listed under preoperative diagnoses; please see preoperative note for further details.  The risks of cesarean section were discussed with the patient including but were not limited to: bleeding which may require transfusion or reoperation; infection which may require antibiotics; injury to bowel, bladder, ureters or other surrounding organs; injury to the fetus; need for additional procedures including hysterectomy in the event of a life-threatening hemorrhage; placental abnormalities wth subsequent pregnancies, incisional problems, thromboembolic phenomenon and other postoperative/anesthesia complications.   The patient concurred with the proposed plan, giving informed written consent for the procedure.    FINDINGS:  Viable female infant in cephalic presentation.  Apgars 3 and 4.  Clear amniotic fluid.  Intact placenta, three vessel cord.  Normal uterus, fallopian tubes and ovaries bilaterally.  ANESTHESIA: Spinal INTRAVENOUS FLUIDS: 2250 ml ESTIMATED BLOOD LOSS: 750 ml URINE OUTPUT:  800 ml SPECIMENS: Placenta sent to pathology COMPLICATIONS: None immediate  PROCEDURE IN DETAIL:  The patient preoperatively received intravenous antibiotics and had sequential compression devices applied to her lower extremities.  She was then taken to the operating room where spinal anesthesia was administered and was found to be adequate. She was then placed in a dorsal supine position with a leftward tilt, and prepped and draped in a sterile manner.  A foley catheter was placed into her bladder and attached to constant  gravity.  After an adequate timeout was performed, 30 ml of 0.5% Marcaine was injected subcutaneously around the incision site,  a Pfannenstiel skin incision was made with scalpel and carried through to the underlying layer of fascia. The fascia was incised in the midline, and this incision was extended bilaterally using the Mayo scissors. The rectus muscles were transected with cautory medially and the peritoneum was entered bluntly. Attention was turned to the lower uterine segment where a low transverse hysterotomy was made with a scalpel and extended bilaterally bluntly.  The infant was successfully delivered, the cord was clamped and cut and the infant was handed over to awaiting neonatology team. Uterine massage was then administered, and the placenta delivered intact with a three-vessel cord. The uterus was then cleared of clot and debris.  The hysterotomy was closed with 0 Vicryl in a running locked fashion, and an imbricating layer was also placed with 0 Vicryl. The pelvis was cleared of all clot and debris. Hemostasis was confirmed on all surfaces.  The fascia was then closed using 0 Vicryl  in a running fashion.  The subcutaneous layer was irrigated. The skin was closed with a 4-0 Vicryl subcuticular stitch. The patient tolerated the procedure well. Sponge, lap, instrument and needle counts were correct x 2.  She was taken to the recovery room in stable condition.   Tawana Scale, MD OB Fellow

## 2013-10-05 NOTE — Progress Notes (Signed)
Clinical Social Work Department PSYCHOSOCIAL ASSESSMENT - MATERNAL/CHILD 10/05/2013  Patient:  Vickie Gonzales  Account Number:  [id number]  Admit Date:  10/02/2013  Marjo Bicker Name:   Vickie Gonzales    Clinical Social Worker:  Lulu Riding, LCSW   Date/Time:  10/05/2013 11:00 AM  Date Referred:        Other referral source:   No referral-NICU admission    I:  FAMILY / HOME ENVIRONMENT Child's legal guardian:  PARENT  Guardian - Name Guardian - Age Guardian - Address  Vickie Gonzales 27 [address] [place], [place] [address]  [name] involved  incarcerated   Other household support members/support persons Name Relationship DOB  Vickie Gonzales [facility] 03/01/09   Other support:   MOB states her father and brother are her main support people.    II  PSYCHOSOCIAL DATA Information Source:  Patient Interview  Event organiser Employment:   MOB states she had a job interview last month at [employer] and was disappointed to not get the job.  She states she plans to keep looking once she is healed from delivery.   Financial resources:  Medicaid If Medicaid - County:  [employer] / Grade:   Maternity Care Coordinator / Child Services Coordination / Early Interventions:   CC4C  Cultural issues impacting care:   None stated    III  STRENGTHS Strengths  Compliance with medical plan  Other - See comment  Supportive family/friends  Understanding of illness   Strength comment:  Pediatric follow up will be at [facility] which is where MOB and her son go for medical care.   IV  RISK FACTORS AND CURRENT PROBLEMS Current Problem:  YES   Risk Factor & Current Problem Patient Issue Family Issue Risk Factor / Current Problem Comment  Financial Resources Y N   Housing Concerns Y N MOB states she lives in section 8 housing and currently has no heat.  She states she has reported this and the landlord has not yet made the necessary repairs  or moved her to a different unit.  MOB is quite upset about this issue, but thinks it is finally going to be resolved.   N N     V  SOCIAL WORK ASSESSMENT  CSW met with MOB in her third floor room/304 to introduce myself and complete assessment for NICU admission.  MOB was tearful when CSW arrived, but welcomed CSW and seemed appreciative of the visit.  She was upset about her situation with her heat, but thinks it will finally be taken care of.  CSW offered to make some phone calls for her, but she does not think that is necessary at this time.  CSW asked her to contact CSW if she changes her mind.  CSW ensured that MOB has another place to go if baby is ready for discharge prior to her home situation being remediated.  MOB ensured CSW that baby will not go to a home without heat and that her 25 year old is currently staying with her dad and brother at this time.  MOB states that she and the baby will also go there if necessary, athough she is hopeful this will not be the case as she is fearful of feeling unsettled at baby's discharge if this takes place.  CSW validated MOB's feelings, but used a strength based approach to help MOB identify positive aspects to her situation.  She states she has a lot to  be thankful for and that she thanks her family frequently.  CSW asked MOB about her positive drug screen and limited PNC.  MOB states she was in denial about the pregnancy and admits to marijuana use.  She thinks her last use was at approximately 6 months pregnant.  CSW encouraged her to discontinue use and she states she has no plans of continuing.  CSW explained hospital drug screen policy and encouraged MOB to identify the reason she smoked marijuana and try to replace with a healthier activity to fill that need.  MOB states she started smoking marijuana when her mother passed away 7 years ago.  MOB states this was a very emotional time for her.    CSW asked if she had counseling to process her feelings  surrounding her mother's passing.  She states she did not.  She reports she felt she was strong enough to handle it on her own.  CSW discussed signs and symptoms of PPD as well as positive coping strategies and made a recommendation that MOB consider outpatient counseling as a way to process her emotions and develop positive coping mechanisms.  MOB states she will think about it and will let CSW know if she is interested.  MOB states she has some supplies for baby, but is struggling financially at this time.  CSW will make a referral to [employer] for baby basics.  MOB has a pack n play.  She informed CSW that she slept with her first son in bed with her.  CSW strongly cautioned her against this, informing her that it was a cause of SIDS.  CSW discussed safe sleep with MOB.  She was very receptive.  MOB states her first son weighed 4.5 lbs at birth and spent a week in NICU.  She recalls meeting CSW at that time.  She states she has no questions about the NICU as she knows what to expect given this is not her first experience.  MOB states no other questions or needs and thanked CSW for the visit.  She seemed very appreciative.  CSW gave contact information and asked MOB to call at any time while baby is in NICU.  CSW also offered  (6) bus passes, which MOB accepted and was grateful for.     VI SOCIAL WORK PLAN Social Work Plan  Psychosocial Support/Ongoing Assessment of Needs   Type of pt/family education:   Emotional health/benefits of outpatient counseling  Ongoing support offered by NICU CSW  Hospital drug screen policy   If child protective services report - county:   If child protective services report - date:   Information/referral to community resources comment:   No referrals made at this time.   Other social work plan:   CSW will monitor drug screen results.

## 2013-10-05 NOTE — Progress Notes (Signed)
Subjective: Postpartum Day 2: Cesarean Delivery Patient reports tolerating PO, + flatus, + BM and no problems voiding.    Ms. Date is a 27 yo G3P112 with known pre-eclampsia and pre-gestational diabetes who presented at [redacted]w[redacted]d to the MAU on 11/3 with placental abruption. Pt taken to OR for RLTCS. EBL 750 mL with no immediate complications. Baby to NICU. Pt started on Mag sulfate 2g/hr on 11/4, stopped at 5 am on 11/5. Fasting CBG 11/6 7:40 am: 70. SBP running in the 1402-150s, DBP running in the 80s-90s.  Twp severe range BP of 164/89 on 11/5 at 9am and 162/89 on 11/5 at 6am.   Vickie Gonzales states that she continues to be sore around her incision site, but it is better today.  She took a percocet last night, which helped a lot with her discomfort. Ms. [name] denies any bleeding or drainage of fluid from her incision site, but does report light vaginal bleeding that has improved from yesterday; it is described as alight period with occasional small clots. She has been ambulating and urinating per usual, she has passed flatus ans has had a BM. Her appetite is normal and she has been tolerating PO. She does report mild swelling of both feet since surgery, but denies any leg or calf pain; the swelling is about the same as yesterday. She denies HA, CP, SOB, N/V, diarrhea, fever or chills. She is bottle feeding with baby in the NICU, undecided about circumcision, and wants to use OCPs for contraception.  She is taking glyburide 2.5mg  BID.     Objective: Vital signs in last 24 hours: Temp:  [97.9 F (36.6 C)-98.4 F (36.9 C)] 98.4 F (36.9 C) (11/06 0603) Pulse Rate:  [96-113] 98 (11/06 0603) Resp:  [16-18] 18 (11/06 0603) BP: (136-164)/(75-93) 136/81 mmHg (11/06 0603) SpO2:  [99 %-100 %] 100 % (11/06 0603) Weight:  [92.761 kg (204 lb 8 oz)] 92.761 kg (204 lb 8 oz) (11/06 0606) Most BPs continue to improve Physical Exam:  General: alert, cooperative and no distress Lochia: appropriate Uterine  Fundus: firm Incision: no significant drainage, no significant erythema DVT Evaluation: No evidence of DVT seen on physical exam. Negative Homan's sign. No cords or calf tenderness. No significant calf/ankle edema. Mild bilateral pedal edema, non-pitting   Recent Labs  10/02/13 2220 10/03/13 0645  HGB 11.8* 9.5*  HCT 33.8* 27.0*    Assessment/Plan: Status post Cesarean section. Doing well postoperatively.  Continue current care. Likely discharge tomorrow. Glucoses at goal  [name] 10/05/2013, 7:44 AM   Just post shower and dressing was wet. Will replace steri strips and honey comb I spoke with and examined patient and agree with PA-S's note and plan of care.  Tawana Scale, MD Ob Fellow 10/05/2013 9:49 AM

## 2013-10-06 ENCOUNTER — Ambulatory Visit: Payer: Self-pay

## 2013-10-06 ENCOUNTER — Encounter: Payer: Self-pay | Admitting: Obstetrics and Gynecology

## 2013-10-06 MED ORDER — OXYCODONE-ACETAMINOPHEN 5-325 MG PO TABS: 1.0000 | ORAL_TABLET | ORAL | Status: DC | PRN

## 2013-10-06 NOTE — Lactation Note (Signed)
This note was copied from the chart of Vickie Destin Kittler. Lactation Consultation Note       Follow up consult with this mom of a NICU baby, now 82 hours post partum,and 36 1/[redacted] weeks gestation.    Mom is being discharged to home today, and is able to pump up to 4 ounces of transitional milk. She is active with [facility] Candle knows to go today for a DEP. I will follow her and her baby in the NICU.  Patient Name: Vickie Gonzales [id number] Date: 10/06/2013 Reason for consult: Follow-up assessment;NICU baby   Maternal Data    Feeding Feeding Type: Formula Nipple Type: Slow - flow Length of feed: 5 min  LATCH Score/Interventions                      Lactation Tools Discussed/Used     Consult Status Consult Status: PRN Follow-up type:  (in NICU)    Alfred Levins 10/06/2013, 1:01 PM

## 2013-10-06 NOTE — Progress Notes (Signed)
Pt. Is discharged  In the care of friend, escorted to [facility] by N.T.. Denies any pain or discomfort. Infant to remain in [facility]. Honeycomb dressing is in place and drain ing well Spirits good Discharge instructions with RX were given to pt. Understands  All instructions well.

## 2013-10-06 NOTE — Discharge Summary (Signed)
Obstetric Discharge Summary Reason for Admission: cesarean section Prenatal Procedures: Preeclampsia Intrapartum Procedures: cesarean: low cervical, transverse Postpartum Procedures: none Complications-Operative and Postpartum: none Hemoglobin  Date Value Range Status  10/03/2013 9.5* 12.0 - 15.0 g/dL Final     DELTA CHECK NOTED     REPEATED TO VERIFY     HCT  Date Value Range Status  10/03/2013 27.0* 36.0 - 46.0 % Final  Last Capillary Glucose: 59 @ 05:34 10/06/13  Filed Vitals:   10/06/13 0613  BP: 141/96  Pulse: 60  Temp:   Resp:     Physical Exam:  General: alert, cooperative and mild distress - physically well, but emotionally upset / weepy - pt concerned to leave hospital without infant; pt feels safe at home with no HI/SI Lochia: appropriate Uterine Fundus: firm Incision: healing well DVT Evaluation: No evidence of DVT seen on physical exam. No cords or calf tenderness. No significant calf/ankle edema. Cardiac: Normal S1/S2 with regular rate; no murmur, rub, S3, S4. Distal pulses regular rate bilaterally. Pulmonary: Lungs clear to auscultation bilaterally. GI / Abdominal: Patient has had post-op BM; bowel sounds present in all quadrants; wound dressing intact with no edema, erythema, warmth, or other signs of infection / poor healing present Discharge Diagnoses: Abruptio Placentae - Pre-Term LTCS; DM, Hyperlipidemia, Obesity, Tobacco Abuse, History Preeclampsia    Hospital Course:  P1112 at [redacted]w[redacted]d presented to MAU by EMS reporting gush of blood at 2200 followed by continuous low abd pain.. Prior C/S for [facility]. Known Pre-E.and B DM. She was taken to OR for RLTCS. Uncomplicated intraoperative and postop course. Had some drainage on honeycomb dressing so it was replaced on HD#2.  CBGs 59-80's on glyburide post op  Vickie Gonzales, CNM 10/06/2013 10:55 AM    Discharge Information: Date: 10/06/2013 Activity: unrestricted Diet: routine Medications: Percocet Condition:  improved Instructions: refer to practice specific booklet Discharge to: home Consider Social Work Consult Prior to Discharge Oral Contraceptive Pills Breast Feeding - Patient Consulted by Lactation and Familiar with Breast Pump Use  Newborn Data: Live born female  Birth Weight: 5 lb 4.7 oz (2400 g) APGAR: 3, 4  Home with Mother after release from NICU.Marland Kitchen  Jefm Petty 10/06/2013, 7:54 AM  Evaluation and management procedures were performed by PA-S under my supervision/collaboration. Chart reviewed, patient examined by me and I agree with management and plan with following additions:  Dressing C/D/I. Pumping without difficulty. No preE sx.  Baby stable in NICU started on po feeds.  CBGs low.  Filed Vitals:   10/06/13 0111 10/06/13 0527 10/06/13 0535 10/06/13 0613  BP: 157/90  165/97 141/96  Pulse: 80  61 60  Temp: 98 F (36.7 C)  97.5 F (36.4 C)   TempSrc: Oral  Oral   Resp: 16  18   Height:      Weight:  92.534 kg (204 lb)    SpO2: 100%  100%    D/C Meds: iron bid, ibuprofen, Percocet  D/C Dx; POD#3 RLTCS at 35.4 wks for abruption; preE, possibly new CHTN; B DM; ABL Fe deficient anemia  Instructions: Continue DM diet and hold oral hypoglycemics. Check CBGs and recheck here or HH nurse in 1 week.: F/U CBGs, BPs, Rx Micronor Remove honeycomb dressing in 3 days.    Vickie Gonzales, CNM 10/06/2013 10:57 AM

## 2013-10-09 ENCOUNTER — Other Ambulatory Visit: Payer: Medicaid Other

## 2013-11-08 ENCOUNTER — Ambulatory Visit: Payer: Medicaid Other | Admitting: Obstetrics and Gynecology

## 2013-11-17 NOTE — H&P (Signed)
Attestation of Attending Supervision of Fellow: Evaluation and management procedures were performed by the Fellow under my supervision and collaboration.  I have reviewed the Fellow's note and chart, and I agree with the management and plan.    

## 2014-10-01 ENCOUNTER — Encounter (HOSPITAL_COMMUNITY): Payer: Self-pay | Admitting: Obstetrics & Gynecology

## 2014-11-07 ENCOUNTER — Ambulatory Visit: Payer: Medicaid Other | Admitting: Family Medicine

## 2015-01-24 ENCOUNTER — Other Ambulatory Visit (HOSPITAL_COMMUNITY)
Admission: RE | Admit: 2015-01-24 | Discharge: 2015-01-24 | Disposition: A | Discharge status: Home / Self Care | Payer: Medicaid Other | Source: Ambulatory Visit | Attending: Family Medicine | Admitting: Family Medicine

## 2015-01-24 ENCOUNTER — Ambulatory Visit (INDEPENDENT_AMBULATORY_CARE_PROVIDER_SITE_OTHER): Payer: Self-pay | Admitting: Family Medicine

## 2015-01-24 ENCOUNTER — Encounter: Payer: Self-pay | Admitting: Family Medicine

## 2015-01-24 VITALS — BP 150/103 | HR 108 | Ht 65.0 in | Wt 203.0 lb

## 2015-01-24 DIAGNOSIS — Z113 Encounter for screening for infections with a predominantly sexual mode of transmission: Secondary | ICD-10-CM | POA: Insufficient documentation

## 2015-01-24 DIAGNOSIS — Z202 Contact with and (suspected) exposure to infections with a predominantly sexual mode of transmission: Secondary | ICD-10-CM

## 2015-01-24 DIAGNOSIS — N898 Other specified noninflammatory disorders of vagina: Secondary | ICD-10-CM | POA: Insufficient documentation

## 2015-01-24 DIAGNOSIS — N760 Acute vaginitis: Secondary | ICD-10-CM

## 2015-01-24 LAB — POCT WET PREP (WET MOUNT): Clue Cells Wet Prep Whiff POC: POSITIVE

## 2015-01-24 MED ORDER — FLUCONAZOLE 150 MG PO TABS: 150.0000 mg | ORAL_TABLET | Freq: Once | ORAL | Status: DC

## 2015-01-24 MED ORDER — METRONIDAZOLE 500 MG PO TABS: 500.0000 mg | ORAL_TABLET | Freq: Two times a day (BID) | ORAL | Status: DC

## 2015-01-24 NOTE — Progress Notes (Signed)
   Subjective:    Patient ID: Vickie Gonzales, female    DOB: Nov 12, 1986, 29 y.o.   MRN: [id number]  HPI 29 year old female presents for same day appointment with complex vaginal discharge.  1) Vaginal discharge  Onset: 2 months ago.   Description: White.  Odor: Yes. Fishy odor.  Itching: Patient notes severe itching.  Recent antibiotic use: No. She has used an over-the-counter cream with no improvement.  Exposure/Concern for STD: No current concern but she would like to be tested.   ROS:  Fever: no  Dysuria: no  Bleeding: no  Missed period: no  Back pain: no  Pelvic pain: no  Genital sores: no  Rash: no   Review of Systems Per HPI    Objective:   Physical Exam Filed Vitals:   01/24/15 1447  BP: 150/103  Pulse: 108   Exam: General: well appearing, NAD. Pelvic Exam:        External: irritation noted.         Vagina: white discharge noted.         Cervix: normal without lesions or masses        Samples for Wet prep, GC/Chlamydia obtained    Assessment & Plan:  See Problem List

## 2015-01-24 NOTE — Patient Instructions (Signed)
It was nice to see you today.  Take the antibiotic as prescribed.  Use the diflucan after the course of antibiotics.  Take as prescribed.  Follow up annually or sooner if needed.  Take care  Dr. Adriana Simasook

## 2015-01-24 NOTE — Assessment & Plan Note (Signed)
Wet prep revealed clue cells consistent with bacterial vaginosis as well as trichomonas. No yeast was seen but I suspect this based on physical exam. Will treat with Diflucan and metronidazole. I advised her to inform her partner about the trichomonas.

## 2015-01-25 ENCOUNTER — Telehealth: Payer: Self-pay | Admitting: *Deleted

## 2015-01-25 LAB — GC/CHLAMYDIA PROBE AMP (~~LOC~~) NOT AT ARMC
CHLAMYDIA, DNA PROBE: NEGATIVE
NEISSERIA GONORRHEA: NEGATIVE

## 2015-01-25 LAB — RPR

## 2015-01-25 LAB — HIV ANTIBODY (ROUTINE TESTING W REFLEX): HIV: NONREACTIVE

## 2015-01-25 NOTE — Telephone Encounter (Signed)
-----   Message from Tommie SamsJayce G Cook, DO sent at 01/25/2015  8:29 AM EST ----- Please inform patient of negative test results (HIV, RPR, GC/Chlamydia).

## 2015-01-25 NOTE — Telephone Encounter (Signed)
Spoke with patient and informed her of lab results.

## 2015-06-01 IMAGING — US US OB FOLLOW-UP
1 series · 16 of 28 positions shown · non-contrast
Comparison: none

OBSTETRICS REPORT
                      (Signed Final 08/15/2013 [DATE])

Service(s) Provided
 US OB FOLLOW UP                                       76816.1
Indications
 Diabetes - Pregestational, Class C (onset ate 10-
 19, with duration of 10-19 yrs) BEGAN
 GLYBURIDE [DATE]
 Cigarette smoker
 Obesity complicating pregnancy                        649.13,
 Poor obstetric history: Previous preterm delivery
 (IUGR)
 History of cesarean delivery, currently pregnant      654.20,
 Follow-up incomplete fetal anatomic evaluation
Fetal Evaluation
 Num Of Fetuses:    1
 Fetal Heart Rate:  139                          bpm
 Cardiac Activity:  Observed
 Presentation:      Cephalic
 Placenta:          Posterior, above cervical
                    os
 P. Cord            Visualized
 Insertion:
 Comment:    Bpp observed in 28 minutes.
 Amniotic Fluid
 AFI FV:      Subjectively within normal limits
 AFI Sum:     14.08   cm       46  %Tile     Larg Pckt:    4.03  cm
 RUQ:   3.48    cm   RLQ:    4.03   cm    LUQ:   2.6     cm   LLQ:    3.97   cm
Biophysical Evaluation
 Amniotic F.V:   Within normal limits       F. Tone:        Observed
 F. Movement:    Observed                   Score:          [DATE]
 F. Breathing:   Observed
Biometry
 BPD:     69.5  mm     G. Age:  27w 6d                CI:        74.74   70 - 86
                                                      FL/HC:      20.2   19.6 -
 HC:     255.1  mm     G. Age:  27w 5d        4  %    HC/AC:      1.05   0.99 -
 AC:     243.3  mm     G. Age:  28w 4d       40  %    FL/BPD:     74.2   71 - 87
 FL:      51.6  mm     G. Age:  27w 4d       10  %    FL/AC:      21.2   20 - 24
 Est. FW:    8844  gm    2 lb 10 oz      39  %
Gestational Age
 LMP:           28w 5d        Date:  01/26/13                 EDD:   11/02/13
 U/S Today:     27w 6d                                        EDD:   11/08/13
 Best:          28w 5d     Det. By:  LMP  (01/26/13)          EDD:   11/02/13
Anatomy
 Cranium:          Appears normal         Aortic Arch:      Not well visualized
 Fetal Cavum:      Not well visualized    Ductal Arch:      Not well visualized
 Ventricles:       Appears normal         Diaphragm:        Previously seen
 Choroid Plexus:   Previously seen        Stomach:          Appears normal, left
                                                            sided
 Cerebellum:       Previously seen        Abdomen:          Previously seen
 Posterior Fossa:  Previously seen        Abdominal Wall:   Previously seen
 Nuchal Fold:      Previously seen        Cord Vessels:     Previously seen
 Face:             Orbits and profile     Kidneys:          Appear normal
                   previously seen
 Lips:             Previously seen        Bladder:          Appears normal
 Heart:            Appears normal         Spine:            Appears normal
                   (4CH, axis, and
                   situs)
 RVOT:             Not well visualized    Lower             Previously seen
                                          Extremities:
 LVOT:             Not well visualized    Upper             Previously seen
 Other:  Fetus appears to be a male. Heels and 5th digit previously visualized.
         Nasal bone previously visualized. Technically difficult due to maternal
         habitus and fetal position.
Cervix Uterus Adnexa
 Cervical Length:    3.6      cm
 Cervix:       Normal appearance by transabdominal scan.
 Uterus:       No abnormality visualized.
 Cul De Sac:   No free fluid seen.
 Left Ovary:    Not visualized.
 Right Ovary:   Not visualized.
 Adnexa:     No abnormality visualized.
Impression
INDICATION: 27 yr old [id number] at 29w4d with type II diabetes
 and previous child with growth restriction for follow up
 ultrasound and BPP. Remote read.

[Series 1: us fetal bpp w/o nonstress · non-contrast · 75 acquisitions, 16 frames shown]
[im 1/75]
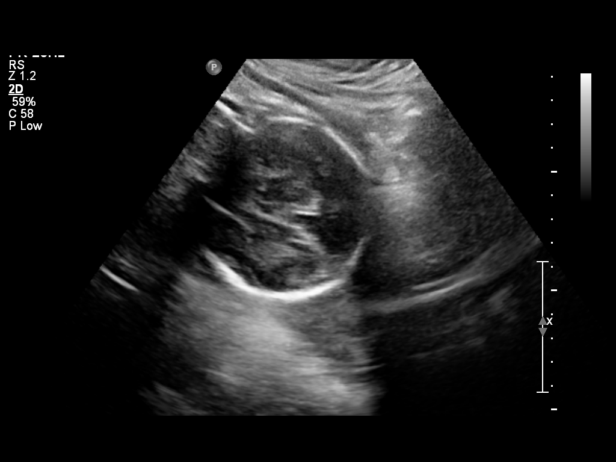
[im 6/75]
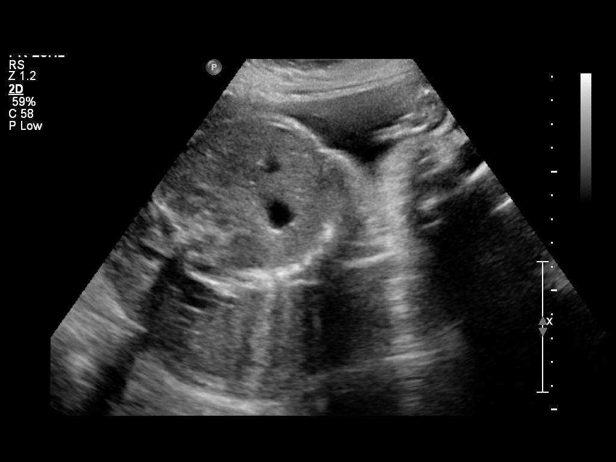
[im 11/75]
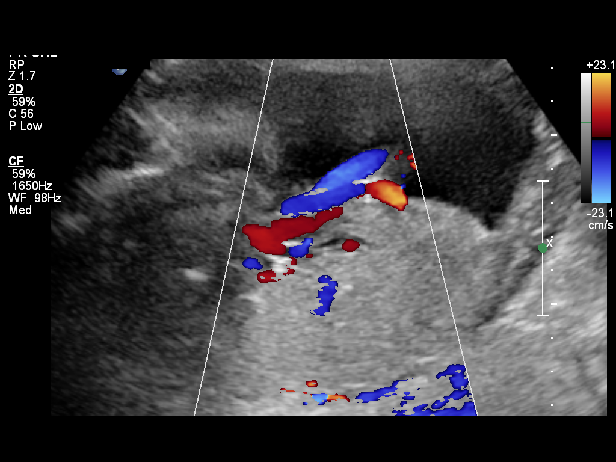
[im 17/75]
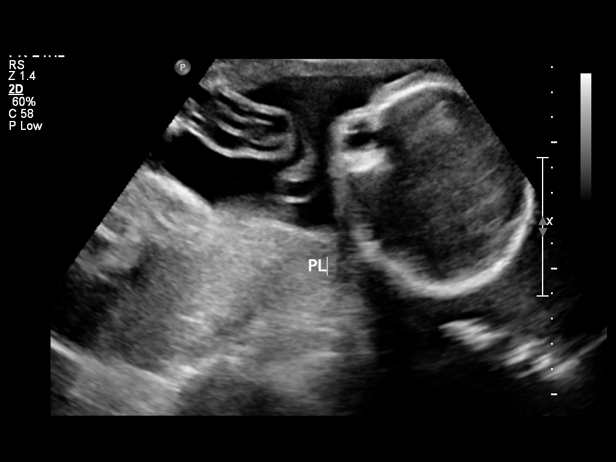
[im 20/75]
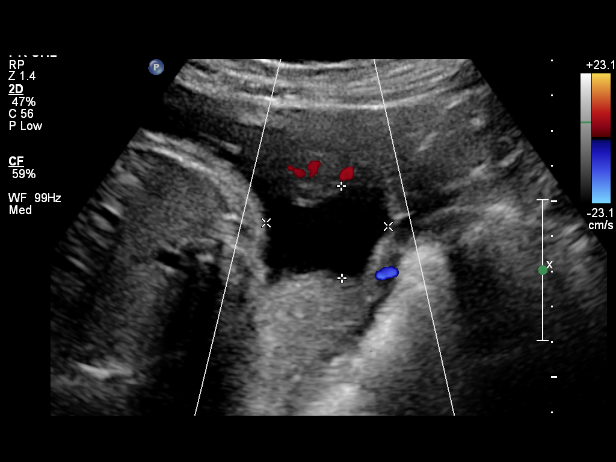
[im 25/75]
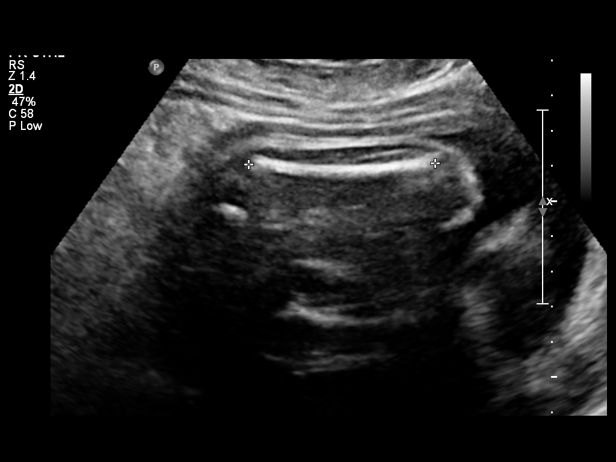
[im 31/75]
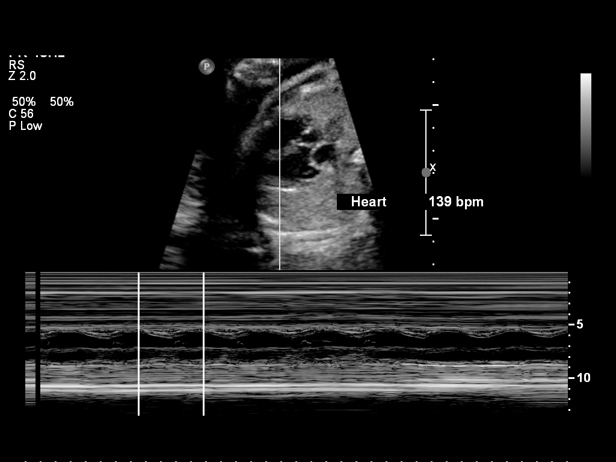
[im 36/75]
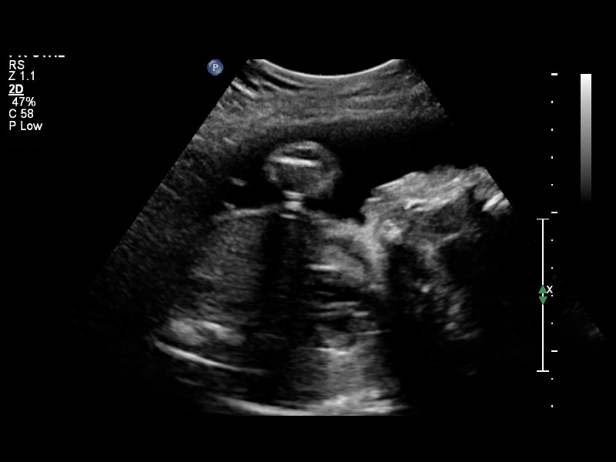
[im 39/75]
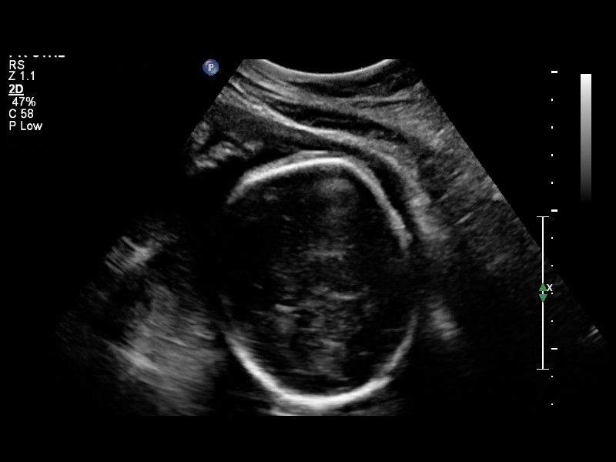
[im 44/75]
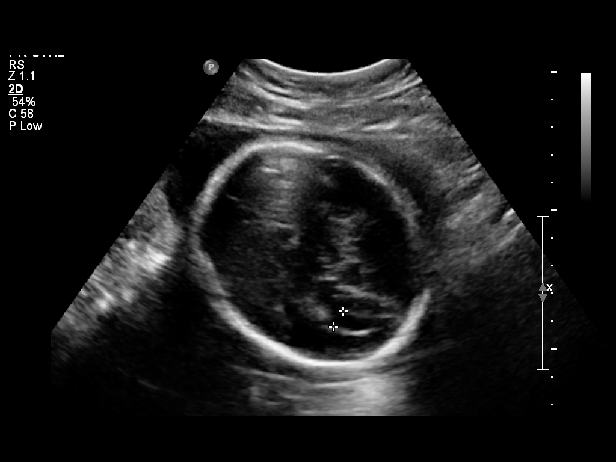
[im 50/75]
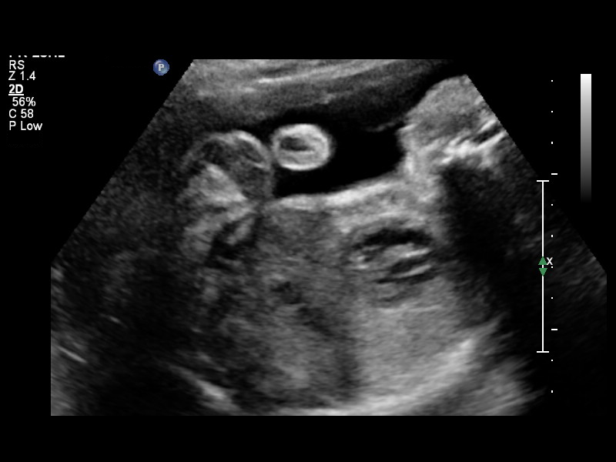
[im 55/75]
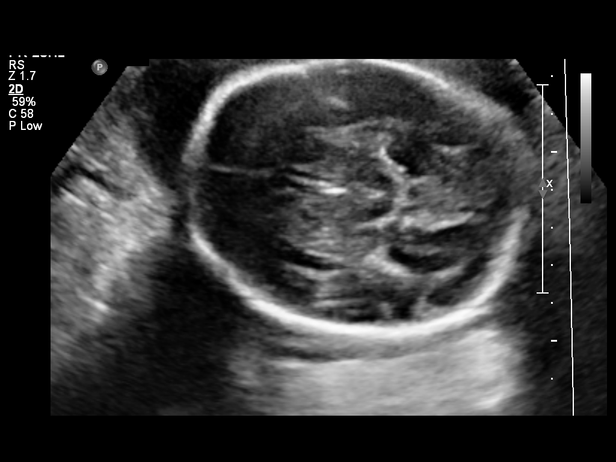
[im 58/75]
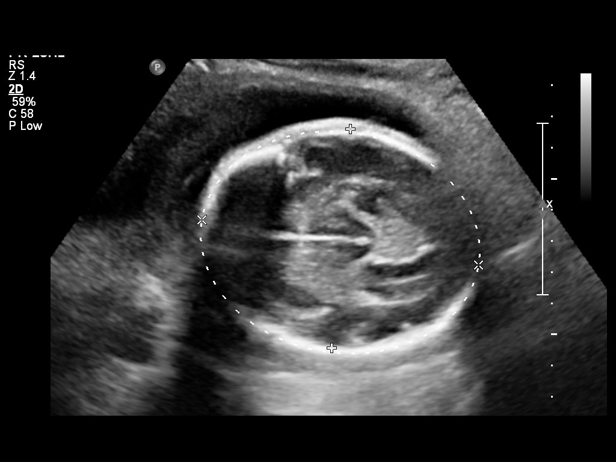
[im 64/75]
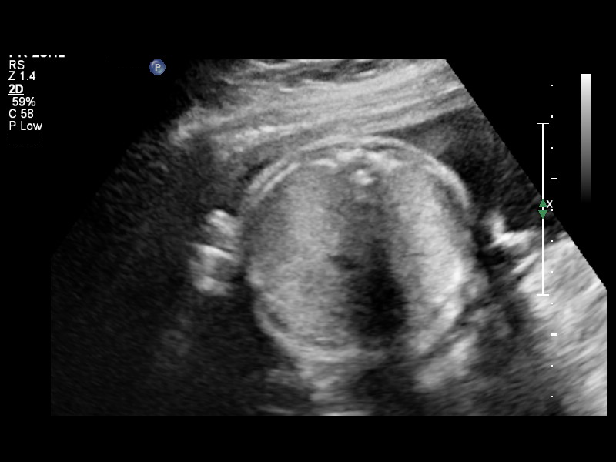
[im 69/75]
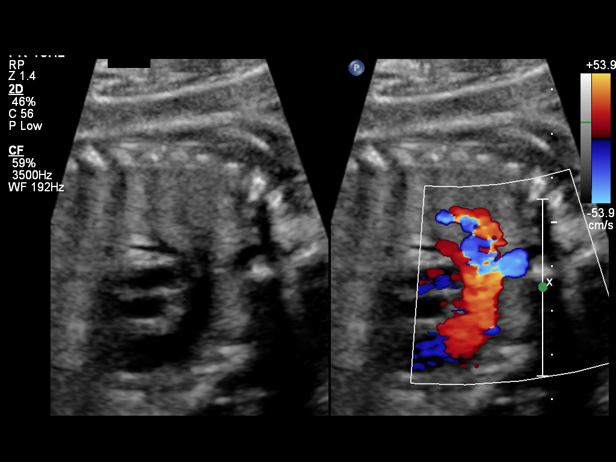
[im 75/75]
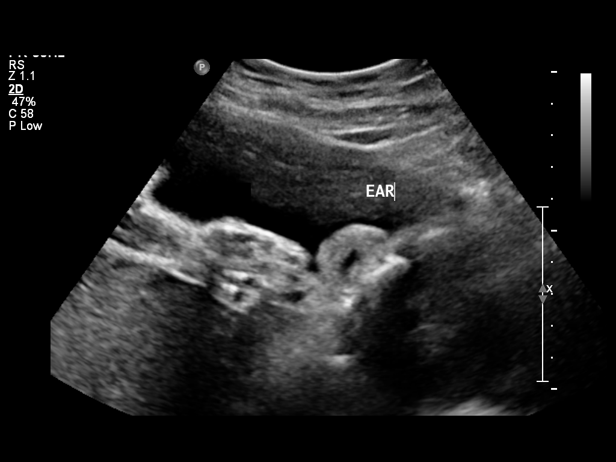

[16 of 28 positions shown; findings below may reference images not displayed]

FINDINGS: 1. Single intrauterine pregnancy.
 2. Estimated fetal weight is in the 39th%.
 3. Posterior placenta without evidence of previa.
 4. Normal amniotic fluid index.
 5. The views of the heart remain limited.
 6. The remainder of the limited anatomy survey is normal.
 Any anatomy not evaluated on today's exam was evaluated
 on the previous exam.
 7. Normal biophysical profile of [DATE].
Recommendations

 1. Appropriate fetal growth.
 2. Limited anatomy survey:
 - heart views could not be cleared given this in the context of
 type II diabetes recommend fetal echocardiogram asap (if not
 already done)
 3. Diabetes:
 - on glyburide
 - recommend fetal growth every 4 weeks
 - recommend start antenatal testing at 32 weeks
 - recommend delivery by estimated due date but not prior to
 39 weeks in the absence of other complications
 - recommend fetal echocardiogram as above
 4. Previous child with growth restriction:
 - appropriate fetal growth today
 - recommend follow fetal growth as above

 questions or concerns.

## 2016-03-30 ENCOUNTER — Ambulatory Visit: Payer: Medicaid Other | Admitting: Family Medicine

## 2016-09-30 ENCOUNTER — Encounter: Payer: Self-pay | Admitting: Family Medicine

## 2016-09-30 ENCOUNTER — Ambulatory Visit (INDEPENDENT_AMBULATORY_CARE_PROVIDER_SITE_OTHER): Payer: Medicaid Other | Admitting: Family Medicine

## 2016-09-30 ENCOUNTER — Encounter: Payer: Medicaid Other | Admitting: Family Medicine

## 2016-09-30 ENCOUNTER — Other Ambulatory Visit (HOSPITAL_COMMUNITY)
Admission: RE | Admit: 2016-09-30 | Discharge: 2016-09-30 | Disposition: A | Discharge status: Home / Self Care | Payer: Medicaid Other | Source: Ambulatory Visit | Attending: Family Medicine | Admitting: Family Medicine

## 2016-09-30 VITALS — BP 126/73 | HR 101 | Temp 97.9°F | Ht 65.0 in | Wt 186.4 lb

## 2016-09-30 DIAGNOSIS — Z1151 Encounter for screening for human papillomavirus (HPV): Secondary | ICD-10-CM | POA: Insufficient documentation

## 2016-09-30 DIAGNOSIS — E119 Type 2 diabetes mellitus without complications: Secondary | ICD-10-CM

## 2016-09-30 DIAGNOSIS — Z124 Encounter for screening for malignant neoplasm of cervix: Secondary | ICD-10-CM | POA: Diagnosis not present

## 2016-09-30 DIAGNOSIS — Z0000 Encounter for general adult medical examination without abnormal findings: Secondary | ICD-10-CM

## 2016-09-30 DIAGNOSIS — F17200 Nicotine dependence, unspecified, uncomplicated: Secondary | ICD-10-CM

## 2016-09-30 DIAGNOSIS — Z01419 Encounter for gynecological examination (general) (routine) without abnormal findings: Secondary | ICD-10-CM | POA: Insufficient documentation

## 2016-09-30 LAB — CBC
HEMATOCRIT: 42.1 % (ref 35.0–45.0)
HEMOGLOBIN: 13.9 g/dL (ref 11.7–15.5)
MCH: 32.4 pg (ref 27.0–33.0)
MCHC: 33 g/dL (ref 32.0–36.0)
MCV: 98.1 fL (ref 80.0–100.0)
MPV: 10.5 fL (ref 7.5–12.5)
Platelets: 371 10*3/uL (ref 140–400)
RBC: 4.29 MIL/uL (ref 3.80–5.10)
RDW: 13.8 % (ref 11.0–15.0)
WBC: 10.1 10*3/uL (ref 3.8–10.8)

## 2016-09-30 LAB — BASIC METABOLIC PANEL WITH GFR
BUN: 7 mg/dL (ref 7–25)
CHLORIDE: 106 mmol/L (ref 98–110)
CO2: 21 mmol/L (ref 20–31)
Calcium: 9.1 mg/dL (ref 8.6–10.2)
Creat: 0.68 mg/dL (ref 0.50–1.10)
GFR, Est African American: >89 mL/min (ref >=60)
GLUCOSE: 143 mg/dL — AB (ref 65–99)
POTASSIUM: 3.9 mmol/L (ref 3.5–5.3)
Sodium: 138 mmol/L (ref 135–146)

## 2016-09-30 LAB — POCT GLYCOSYLATED HEMOGLOBIN (HGB A1C): Hemoglobin A1C: 8.5

## 2016-09-30 LAB — TSH: TSH: 0.74 mIU/L

## 2016-09-30 MED ORDER — NORGESTIMATE-ETH ESTRADIOL 0.25-35 MG-MCG PO TABS: 1.0000 | ORAL_TABLET | Freq: Every day | ORAL | 11 refills | Status: DC

## 2016-09-30 MED ORDER — VARENICLINE TARTRATE 0.5 MG PO TABS: ORAL_TABLET | ORAL | 3 refills | Status: DC

## 2016-09-30 MED ORDER — METFORMIN HCL 500 MG PO TABS: ORAL_TABLET | ORAL | 1 refills | Status: DC

## 2016-09-30 MED ORDER — BLOOD GLUCOSE MONITOR KIT: PACK | 0 refills | Status: DC

## 2016-09-30 NOTE — Patient Instructions (Signed)
Thank you so much for coming to visit today! Your A1C is increased to 8.5 today. I have sent prescriptions for Metformin, Chantix, and Sprintec to the pharmacy. We will check several labs today. I will mail you a letter with the results. I have sent a prescription for a glucometer to the pharmacy. Please check your blood sugar once daily, alternating before breakfast and an hour after your largest meal of the day. Please return in 3 months.   Dr. Caroleen Hammanumley

## 2016-10-01 DIAGNOSIS — Z0000 Encounter for general adult medical examination without abnormal findings: Secondary | ICD-10-CM | POA: Insufficient documentation

## 2016-10-01 LAB — C-PEPTIDE: C-Peptide: 1.9 ng/mL (ref 0.80–3.85)

## 2016-10-01 NOTE — Progress Notes (Signed)
Subjective:     Patient ID: Vickie Gonzales, female   DOB: [birth year], 30 y.o.   MRN: [id number]  HPI Vickie Gonzales is a 30yo female presenting for annual exam to follow up  Diabetes and general Health Maintenance. # Diabetes: - Last A1C 6.1 in 07/2013. Reports she lost her insurance and was no longer able to come for physicals. - Not currently on medication. - Reports she previously tried Metformin several years ago. She remembers it caused diarrhea, but she is unsure if she was on it long enough to develop a tolerance. - Reports she was diagnosed at age 30 with diabetes. No workup concerning type 1 vs. Type 2 diabetes - Does not have glucometer  # Health Maintenance: - Last pap smear 05/2013 - Last menstrual period 09/06/16 - Requests birth control. Denies history of migraines, blood clots, family history of blood clots. Would like to initiate oral contraception. History of smoking noted, discussed below. - Requests aid with smoking cessation. Not interested in nicotine patch. Would prefer oral Chantix.  - Family history reviewed. Reports maternal history of diabetes, breast cancer, hypertension, hyperlipidemia, heart disease. Mother deceased secondary to endocarditis. Reports paternal history of cirrhosis, hypertension, and hearing loss.   Review of Systems Per HPI    Objective:   Physical Exam  Constitutional: She appears well-developed and well-nourished. No distress.  HENT:  Head: Normocephalic and atraumatic.  Mouth/Throat: Oropharynx is clear and moist.  Normal right and left tympanic membranes  Cardiovascular: Normal rate, regular rhythm and intact distal pulses.   No murmur heard. Pulmonary/Chest: Effort normal. No respiratory distress. She has no wheezes.  Abdominal: Soft. Bowel sounds are normal. She exhibits no distension. There is no tenderness.  Genitourinary: Vagina normal. No vaginal discharge found.  Musculoskeletal: She exhibits no edema.  Neurological:  Sensation  intact over feet bilaterally  Skin: No rash noted.  Psychiatric: She has a normal mood and affect. Her behavior is normal.      Assessment and Plan:     Diabetes (HCC) - A1C elevated to 8.5 - Discussed Metformin vs. Initiating another agent given her history of diarrhea with Metformin in the past. Vickie Gonzales would like to restart Metformin and see how she does. She is reluctant to initiate another medication at this time.  - Metformin 500mg  daily for one week, then titrate up to 500mg  twice daily - Will check BMP, CBC, TSH. Will also check c-peptide and GAD antibody given young age at onset and worsening with weight loss  - Glucometer prescribed. To check blood sugar once daily and bring log to next visit. -  Return in 3 months or sooner if needed  Healthcare maintenance - Pap smear today - Initiate on Sprintec  TOBACCO USER - Interested in quitting. Chantix prescribed

## 2016-10-01 NOTE — Assessment & Plan Note (Signed)
-   A1C elevated to 8.5 - Discussed Metformin vs. Initiating another agent given her history of diarrhea with Metformin in the past. Vickie Gonzales would like to restart Metformin and see how she does. She is reluctant to initiate another medication at this time.  - Metformin 500mg  daily for one week, then titrate up to 500mg  twice daily - Will check BMP, CBC, TSH. Will also check c-peptide and GAD antibody given young age at onset and worsening with weight loss  - Glucometer prescribed. To check blood sugar once daily and bring log to next visit. -  Return in 3 months or sooner if needed

## 2016-10-01 NOTE — Assessment & Plan Note (Signed)
-   Interested in quitting. Chantix prescribed

## 2016-10-01 NOTE — Assessment & Plan Note (Addendum)
-   Pap smear today - Initiate on Sprintec

## 2016-10-03 LAB — GLUTAMIC ACID DECARBOXYLASE AUTO ABS

## 2016-10-06 LAB — CYTOLOGY - PAP
DIAGNOSIS: NEGATIVE
HPV (WINDOPATH): NOT DETECTED

## 2016-11-19 ENCOUNTER — Ambulatory Visit: Payer: Medicaid Other | Admitting: Family Medicine

## 2017-01-05 NOTE — Progress Notes (Deleted)
   Subjective:    Patient ID: Vickie Gonzales , female   DOB: 1986-05-23 , 31 y.o..   MRN: [id number]  HPI  Vickie Gonzales is here for ***.  Chronic Diabetes  Disease Monitoring  Blood Sugar Ranges: ***  Polyuria: {YES/NO/WILD QIWLN:98921}   Visual problems: {YES/NO/WILD CARDS:18581}   Last hemoglobin A1C:  Lab Results  Component Value Date   HGBA1C 8.5 09/30/2016    Medication Compliance: {YES/NO/WILD CARDS:18581}. Takes Metformin 500 mg BID  Medication Side Effects  Hypoglycemia: {YES/NO/WILD JHERD:40814}   Preventitive Health Care  Eye Exam: Due  Foot Exam: Due  Diet pattern: ***  Exercise: ***  Hyperlipidemia Symptoms Chest pain on exertion:  ***    Leg claudication:   *** Medications (modifying factor): Compliance- ***  Right upper quadrant pain- ***   Muscle aches- *** Duration - years   Timing - continuous    Component Value Date/Time   CHOL 171 08/29/2008 2046   TRIG 57 08/29/2008 2046   HDL 41 08/29/2008 2046   VLDL 11 08/29/2008 2046   CHOLHDL 4.2 Ratio 08/29/2008 2046     Review of Systems: Per HPI. All other systems reviewed and are negative.  Health Maintenance Due  Topic Date Due  . PNEUMOCOCCAL POLYSACCHARIDE VACCINE (1) 06/11/1990  . FOOT EXAM  06/11/1998  . OPHTHALMOLOGY EXAM  06/11/1998  . URINE MICROALBUMIN  06/11/1998  . INFLUENZA VACCINE  06/30/2016    Past Medical History: Patient Active Problem List   Diagnosis Date Noted  . Healthcare maintenance 10/01/2016  . Preeclampsia 10/02/2013  . Previous cesarean delivery, delivered, with or without mention of antepartum condition 06/05/2013  . History of chlamydia infection 01/31/2010  . TOBACCO USER 08/17/2009  . HYPERLIPIDEMIA 07/24/2008  . 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 Diabetes ([place]) 01/27/2007  . OBESITY, NOS 01/27/2007    Medications: reviewed and updated Current Outpatient Prescriptions  Medication Sig Dispense Refill  . blood glucose meter kit and supplies KIT  Dispense based on patient and insurance preference. Use up to four times daily as directed. (FOR ICD-9 250.00, 250.01). 1 each 0  . metFORMIN (GLUCOPHAGE) 500 MG tablet Take one tablet daily for one week and then increase to one tablet twice a day. 90 tablet 1  . norgestimate-ethinyl estradiol (ORTHO-CYCLEN,SPRINTEC,PREVIFEM) 0.25-35 MG-MCG tablet Take 1 tablet by mouth daily. 1 Package 11  . varenicline (CHANTIX) 0.5 MG tablet Take one tablet daily for three days and then increase to one tablet twice a day. 60 tablet 3   No current facility-administered medications for this visit.     Social Hx:  reports that she has been smoking Cigarettes.  She has been smoking about 0.25 packs per day. She has never used smokeless tobacco.   Objective:   There were no vitals taken for this visit. Physical Exam  Gen: NAD, alert, cooperative with exam, well-appearing HEENT: NCAT, PERRL, clear conjunctiva, oropharynx clear, supple neck Cardiac: Regular rate and rhythm, normal S1/S2, no murmur, no edema, capillary refill brisk  Respiratory: Clear to auscultation bilaterally, no wheezes, non-labored breathing Gastrointestinal: soft, non tender, non distended, bowel sounds present Skin: no rashes, normal turgor  Neurological: no gross deficits.  Psych: good insight, normal mood and affect   Assessment & Plan:  No problem-specific Assessment & Plan notes found for this encounter.   Smitty Cords, MD [place], PGY-2

## 2017-01-06 ENCOUNTER — Ambulatory Visit: Payer: Medicaid Other | Admitting: Family Medicine

## 2017-02-11 ENCOUNTER — Encounter (HOSPITAL_COMMUNITY): Payer: Self-pay

## 2017-02-11 ENCOUNTER — Emergency Department (HOSPITAL_COMMUNITY)
Admission: EM | Admit: 2017-02-11 | Discharge: 2017-02-11 | Disposition: A | Discharge status: Home / Self Care | Payer: Medicaid Other | Attending: Emergency Medicine | Admitting: Emergency Medicine

## 2017-02-11 DIAGNOSIS — Z79899 Other long term (current) drug therapy: Secondary | ICD-10-CM | POA: Insufficient documentation

## 2017-02-11 DIAGNOSIS — E1165 Type 2 diabetes mellitus with hyperglycemia: Secondary | ICD-10-CM | POA: Insufficient documentation

## 2017-02-11 DIAGNOSIS — F17210 Nicotine dependence, cigarettes, uncomplicated: Secondary | ICD-10-CM | POA: Insufficient documentation

## 2017-02-11 DIAGNOSIS — R739 Hyperglycemia, unspecified: Secondary | ICD-10-CM

## 2017-02-11 DIAGNOSIS — Z7984 Long term (current) use of oral hypoglycemic drugs: Secondary | ICD-10-CM | POA: Insufficient documentation

## 2017-02-11 DIAGNOSIS — I10 Essential (primary) hypertension: Secondary | ICD-10-CM | POA: Insufficient documentation

## 2017-02-11 LAB — URINALYSIS, ROUTINE W REFLEX MICROSCOPIC
Bacteria, UA: NONE SEEN
Bilirubin Urine: NEGATIVE
Glucose, UA: >=500 mg/dL — AB
Ketones, ur: 80 mg/dL — AB
Leukocytes, UA: NEGATIVE
Nitrite: NEGATIVE
Protein, ur: NEGATIVE mg/dL
Specific Gravity, Urine: 1.029 (ref 1.005–1.030)
pH: 5 (ref 5.0–8.0)

## 2017-02-11 LAB — CBC WITH DIFFERENTIAL/PLATELET
Basophils Absolute: 0 10*3/uL (ref 0.0–0.1)
Basophils Relative: 0 %
Eosinophils Absolute: 0 10*3/uL (ref 0.0–0.7)
Eosinophils Relative: 0 %
HCT: 42.9 % (ref 36.0–46.0)
Hemoglobin: 15.2 g/dL — ABNORMAL HIGH (ref 12.0–15.0)
Lymphocytes Relative: 8 %
Lymphs Abs: 2.1 10*3/uL (ref 0.7–4.0)
MCH: 32.8 pg (ref 26.0–34.0)
MCHC: 35.4 g/dL (ref 30.0–36.0)
MCV: 92.7 fL (ref 78.0–100.0)
Monocytes Absolute: 1.6 10*3/uL — ABNORMAL HIGH (ref 0.1–1.0)
Monocytes Relative: 6 %
Neutro Abs: 22.9 10*3/uL — ABNORMAL HIGH (ref 1.7–7.7)
Neutrophils Relative %: 86 %
Platelets: 299 10*3/uL (ref 150–400)
RBC: 4.63 MIL/uL (ref 3.87–5.11)
RDW: 13.4 % (ref 11.5–15.5)
WBC: 26.6 10*3/uL — ABNORMAL HIGH (ref 4.0–10.5)

## 2017-02-11 LAB — COMPREHENSIVE METABOLIC PANEL
ALT: 14 U/L (ref 14–54)
AST: 16 U/L (ref 15–41)
Albumin: 3.8 g/dL (ref 3.5–5.0)
Alkaline Phosphatase: 85 U/L (ref 38–126)
Anion gap: 13 (ref 5–15)
BUN: 13 mg/dL (ref 6–20)
CO2: 17 mmol/L — ABNORMAL LOW (ref 22–32)
Calcium: 8.8 mg/dL — ABNORMAL LOW (ref 8.9–10.3)
Chloride: 100 mmol/L — ABNORMAL LOW (ref 101–111)
Creatinine, Ser: 0.78 mg/dL (ref 0.44–1.00)
GFR calc Af Amer: >60 mL/min (ref >60)
GFR calc non Af Amer: >60 mL/min (ref >60)
Glucose, Bld: 403 mg/dL — ABNORMAL HIGH (ref 65–99)
Potassium: 3.8 mmol/L (ref 3.5–5.1)
Sodium: 130 mmol/L — ABNORMAL LOW (ref 135–145)
Total Bilirubin: 1.1 mg/dL (ref 0.3–1.2)
Total Protein: 8.4 g/dL — ABNORMAL HIGH (ref 6.5–8.1)

## 2017-02-11 LAB — CBG MONITORING, ED
Glucose-Capillary: 451 mg/dL — ABNORMAL HIGH (ref 65–99)
Glucose-Capillary: 315 mg/dL — ABNORMAL HIGH (ref 65–99)
Glucose-Capillary: 241 mg/dL — ABNORMAL HIGH (ref 65–99)

## 2017-02-11 LAB — BLOOD GAS, VENOUS
Acid-base deficit: 6.4 mmol/L — ABNORMAL HIGH (ref 0.0–2.0)
Bicarbonate: 17.2 mmol/L — ABNORMAL LOW (ref 20.0–28.0)
O2 Saturation: 91.7 %
Patient temperature: 98.6
pCO2, Ven: 30.3 mmHg — ABNORMAL LOW (ref 44.0–60.0)
pH, Ven: 7.372 (ref 7.250–7.430)
pO2, Ven: 65.8 mmHg — ABNORMAL HIGH (ref 32.0–45.0)

## 2017-02-11 MED ORDER — SODIUM CHLORIDE 0.9 % IV BOLUS (SEPSIS)
1000.0000 mL | Freq: Once | INTRAVENOUS | Status: AC
Start: 2017-02-11 — End: 2017-02-11
  Administered 2017-02-11: 1000 mL via INTRAVENOUS

## 2017-02-11 MED ORDER — METFORMIN HCL 500 MG PO TABS: ORAL_TABLET | ORAL | 1 refills | Status: DC

## 2017-02-11 MED ORDER — SODIUM CHLORIDE 0.9 % IV BOLUS (SEPSIS)
1000.0000 mL | Freq: Once | INTRAVENOUS | Status: AC
  Administered 2017-02-11: 1000 mL via INTRAVENOUS

## 2017-02-11 MED ORDER — METFORMIN HCL 500 MG PO TABS
500.0000 mg | ORAL_TABLET | Freq: Once | ORAL | Status: AC
  Administered 2017-02-11: 500 mg via ORAL
  Filled 2017-02-11: qty 1

## 2017-02-11 MED ORDER — BLOOD GLUCOSE MONITOR KIT: PACK | 0 refills | Status: AC

## 2017-02-11 NOTE — ED Provider Notes (Signed)
[place] DEPT Provider Note   CSN: [id number] Arrival date & time: 02/11/17  0347  By signing my name below, I, Neta Mends, attest that this documentation has been prepared under the direction and in the presence of [employer], PA-C. Electronically Signed: Neta Mends, ED Scribe. 02/11/2017. 10:59 AM.    History   Chief Complaint Chief Complaint  Patient presents with  . Chills  . Emesis  . Tachycardia  . Weakness  . Hyperglycemia    The history is provided by the patient. No language interpreter was used.   HPI Comments:  Vickie Gonzales is a 31 y.o. female with PMHx of DM who presents to the Emergency Department complaining of constant nausea x 2 days. Pt complains of general weakness, fatigue, chills, diaphoresis, vomiting x 1, sore throat, diarrhea. Pt states that her last measured blood sugar was >400. Pt states that the last meal she had was [place] 2 days ago, and has been drinking fluids normally. She drinks EtOH occasionally. No alleviating factors noted. Pt denies fever.    Past Medical History:  Diagnosis Date  . Diabetes mellitus    On insulin during pregnancy otherwise oral meds  . GERD (gastroesophageal reflux disease)   . Hypertension     Patient Active Problem List   Diagnosis Date Noted  . Healthcare maintenance 10/01/2016  . Preeclampsia 10/02/2013  . Previous cesarean delivery, delivered, with or without mention of antepartum condition 06/05/2013  . History of chlamydia infection 01/31/2010  . TOBACCO USER 08/17/2009  . HYPERLIPIDEMIA 07/24/2008  . 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 Diabetes ([place]) 01/27/2007  . OBESITY, NOS 01/27/2007    Past Surgical History:  Procedure Laterality Date  . CESAREAN SECTION    . CESAREAN SECTION N/A 10/03/2013   Procedure: CESAREAN SECTION;  Surgeon: Emily Filbert, MD;  Location: [facility];  Service: Obstetrics;  Laterality: N/A;    OB History    Gravida Para Term Preterm AB Living   _0 SAB TAB Ectopic Multiple Live Births     1     2       Home Medications    Prior to Admission medications   Medication Sig Start Date End Date Taking? Authorizing Provider  norgestimate-ethinyl estradiol (ORTHO-CYCLEN,SPRINTEC,PREVIFEM) 0.25-35 MG-MCG tablet Take 1 tablet by mouth daily. 09/30/16  Yes [place], DO  blood glucose meter kit and supplies KIT Dispense based on patient and insurance preference. Use up to four times daily as directed. (FOR ICD-9 250.00, 250.01). 02/11/17   Okey Regal, PA-C  metFORMIN (GLUCOPHAGE) 500 MG tablet Take one tablet daily for one week and then increase to one tablet twice a day. 02/11/17   Okey Regal, PA-C  varenicline (CHANTIX) 0.5 MG tablet Take one tablet daily for three days and then increase to one tablet twice a day. Patient not taking: Reported on 02/11/2017 09/30/16   Lorna Few, DO    Family History Family History  Problem Relation Age of Onset  . Diabetes Mother   . Cancer Mother     breast cancer    Social History Social History  Substance Use Topics  . Smoking status: Current Every Day Smoker    Packs/day: 0.25    Types: Cigarettes  . Smokeless tobacco: Never Used  . Alcohol use Yes     Comment: occasionally     Allergies   Patient has no known allergies.   Review of Systems Review of  Systems  Constitutional: Positive for chills, diaphoresis and fatigue. Negative for fever.  HENT: Positive for sore throat.   Gastrointestinal: Positive for diarrhea, nausea and vomiting.  Neurological: Positive for weakness.  All other systems reviewed and are negative.    Physical Exam Updated Vital Signs BP (!) 152/93 (BP Location: Left Arm)   Pulse (!) 111   Temp 97.7 F (36.5 C) (Oral)   Resp 16   Ht _0  (1.626 m)   Wt 79.8 kg   LMP 02/11/2017   SpO2 98%   BMI 30.21 kg/m   Physical Exam  Constitutional: She appears well-developed and well-nourished. No distress.  HENT:  Head:  Normocephalic and atraumatic.  Mouth/Throat: Oropharynx is clear and moist.  Eyes: Conjunctivae are normal.  Cardiovascular: Normal rate, regular rhythm and normal heart sounds.   Pulmonary/Chest: Effort normal and breath sounds normal.  Abdominal: Soft. She exhibits no distension. There is no tenderness.  Neurological: She is alert.  Skin: Skin is warm and dry.  Psychiatric: She has a normal mood and affect.  Nursing note and vitals reviewed.    ED Treatments / Results  DIAGNOSTIC STUDIES:  Oxygen Saturation is 98% on RA, normal by my interpretation.    COORDINATION OF CARE:  10:55 AM Will order basic labs and IV fluids. Discussed treatment plan with pt at bedside and pt agreed to plan.   Labs (all labs ordered are listed, but only abnormal results are displayed) Labs Reviewed  CBC WITH DIFFERENTIAL/PLATELET - Abnormal; Notable for the following:       Result Value   WBC 26.6 (*)    Hemoglobin 15.2 (*)    Neutro Abs 22.9 (*)    Monocytes Absolute 1.6 (*)    All other components within normal limits  COMPREHENSIVE METABOLIC PANEL - Abnormal; Notable for the following:    Sodium 130 (*)    Chloride 100 (*)    CO2 17 (*)    Glucose, Bld 403 (*)    Calcium 8.8 (*)    Total Protein 8.4 (*)    All other components within normal limits  URINALYSIS, ROUTINE W REFLEX MICROSCOPIC - Abnormal; Notable for the following:    Color, Urine STRAW (*)    Glucose, UA >=500 (*)    Hgb urine dipstick SMALL (*)    Ketones, ur 80 (*)    Squamous Epithelial / LPF 0-5 (*)    All other components within normal limits  BLOOD GAS, VENOUS - Abnormal; Notable for the following:    pCO2, Ven 30.3 (*)    pO2, Ven 65.8 (*)    Bicarbonate 17.2 (*)    Acid-base deficit 6.4 (*)    All other components within normal limits  CBG MONITORING, ED - Abnormal; Notable for the following:    Glucose-Capillary 451 (*)    All other components within normal limits  CBG MONITORING, ED - Abnormal; Notable for  the following:    Glucose-Capillary 315 (*)    All other components within normal limits  CBG MONITORING, ED - Abnormal; Notable for the following:    Glucose-Capillary 241 (*)    All other components within normal limits    EKG  EKG Interpretation  Date/Time:  Thursday February 11 2017 10:25:55 EDT Ventricular Rate:  131 PR Interval:    QRS Duration: 80 QT Interval:  299 QTC Calculation: 442 R Axis:   57 Text Interpretation:  Sinus tachycardia Left atrial enlargement Borderline repolarization abnormality No old tracing to compare Confirmed by  [place] MD, Nicki Reaper [phone]) on 02/11/2017 11:21:19 AM       Radiology No results found.  Procedures Procedures (including critical care time)  Medications Ordered in ED Medications  metFORMIN (GLUCOPHAGE) tablet 500 mg (not administered)  sodium chloride 0.9 % bolus 1,000 mL (0 mLs Intravenous Stopped 02/11/17 1441)  sodium chloride 0.9 % bolus 1,000 mL (1,000 mLs Intravenous New Bag/Given 02/11/17 1300)  sodium chloride 0.9 % bolus 1,000 mL (0 mLs Intravenous Stopped 02/11/17 1300)   Labs: Point-of-care CBG, venous blood gas, urinalysis, CBC, CMP  Imaging:   Consults:   Therapeutics: Normal saline, metformin  Discharge Meds: Metformin  Assessment/Plan:    Initial Impression / Assessment and Plan / ED Course  I have reviewed the triage vital signs and the nursing notes.  Pertinent labs & imaging results that were available during my care of the patient were reviewed by me and considered in my medical decision making (see chart for details).      Final Clinical Impressions(s) / ED Diagnoses   Final diagnoses:  Hyperglycemia    31 year old female with a history of hypoglycemia presents today with nausea diarrhea and elevated blood sugar.  Patient likely with viral gastroenteritis.  She is very well-appearing in no acute distress.  She has no abdominal tenderness.  Patient's labs show significant elevation in blood glucose.   She has no anion gap.  Patient has a normal pH, with ketones in the urine.  She appears to be at the very beginnings of DKA.  She is very well-appearing in no acute distress.  Patient expressing her concerns with being discharged as she has children at home and does not have anyone to pick them up from school or take care of them.  Patient is tolerating p.o. here.  Her blood sugar dropping with normal saline and without insulin.  She continues to appear very well.  I discussed inpatient versus outpatient management with the patient.  She notes that she recently got insurance and will be able to afford medication.  She also notes that she has a follow-up appointment tomorrow at 2 [facility].  I feel with such a well-appearing patient with very early signs with good compensation that close follow-up with primary care would be appropriate at patient's request.  She is instructed to return immediately if any new or worsening signs or symptoms present.  She was given diet and exercise instructions.  She verbalized her understanding and agreement to today's plan had no further questions or concerns at time of discharge.  Patient care was shared with attending Sherwood Gambler MD who agreed to today's assessment and plan.  New Prescriptions New Prescriptions   BLOOD GLUCOSE METER KIT AND SUPPLIES KIT    Dispense based on patient and insurance preference. Use up to four times daily as directed. (FOR ICD-9 250.00, 250.01).  I personally performed the services described in this documentation, which was scribed in my presence. The recorded information has been reviewed and is accurate.   Okey Regal, PA-C 02/11/17 [id number]    Sherwood Gambler, MD 02/20/17 [phone]

## 2017-02-11 NOTE — Discharge Instructions (Signed)
Please continue adequate intake of water.  Please avoid high sugary foods.  You have a follow-up appointment tomorrow with your primary care.  Please follow-up as directed.  If you develop any new or worsening signs or symptoms please return immediately for further evaluation and management.

## 2017-02-12 ENCOUNTER — Ambulatory Visit: Payer: Medicaid Other | Admitting: Family Medicine

## 2017-03-31 ENCOUNTER — Ambulatory Visit (INDEPENDENT_AMBULATORY_CARE_PROVIDER_SITE_OTHER): Payer: Self-pay | Admitting: Family Medicine

## 2017-03-31 ENCOUNTER — Encounter: Payer: Self-pay | Admitting: Family Medicine

## 2017-03-31 VITALS — BP 162/102 | HR 111 | Temp 98.1°F | Resp 20 | Ht 64.0 in | Wt 178.6 lb

## 2017-03-31 DIAGNOSIS — L723 Sebaceous cyst: Secondary | ICD-10-CM

## 2017-03-31 DIAGNOSIS — I10 Essential (primary) hypertension: Secondary | ICD-10-CM | POA: Insufficient documentation

## 2017-03-31 DIAGNOSIS — R030 Elevated blood-pressure reading, without diagnosis of hypertension: Secondary | ICD-10-CM

## 2017-03-31 DIAGNOSIS — E119 Type 2 diabetes mellitus without complications: Secondary | ICD-10-CM

## 2017-03-31 MED ORDER — METFORMIN HCL 500 MG PO TABS: ORAL_TABLET | ORAL | 1 refills | Status: DC

## 2017-03-31 NOTE — Progress Notes (Signed)
    Subjective: CC: bump on eyelid HPI: Patient is a 31 y.o. female with a past medical history of obesity, DM presenting to clinic today for a same day appt for cyst.  She notes cyst on R side of her face. It has been present x 1 year. Notes it intermittently comes and goes. Unfortunately she's had it for over a week without resolution.  She intermittently uses warm compresses without improvement. Painful to touch or put pressure on it.  No drainage from area. No warmth. No fevers/chills. She's had this before under her axilla and it had to be I&D.  Of note, pt's BP is up today. Denies headaches, SOB, or chest pain. She's also been dx with DM however is not taking anything as she can't afford it.   Social History: smoker  ROS: All other systems reviewed and are negative.  Past Medical History Patient Active Problem List   Diagnosis Date Noted  . Elevated BP without diagnosis of hypertension 03/31/2017  . Sebaceous cyst 03/31/2017  . Healthcare maintenance 10/01/2016  . Preeclampsia 10/02/2013  . Previous cesarean delivery, delivered, with or without mention of antepartum condition 06/05/2013  . History of chlamydia infection 01/31/2010  . TOBACCO USER 08/17/2009  . HYPERLIPIDEMIA 07/24/2008  . 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 Diabetes ([facility]) 01/27/2007  . OBESITY, NOS 01/27/2007    Medications- reviewed and updated  Objective: Office vital signs reviewed. BP (!) 162/102   Pulse (!) 111   Temp 98.1 F (36.7 C) (Oral)   Resp 20   Ht  (1.626 m)   Wt 178 lb 9.6 oz (81 kg)   LMP 03/24/2017 (Approximate)   SpO2 98%   Breastfeeding? No   BMI 30.66 kg/m    Physical Examination:  General: Awake, alert, well- nourished, NAD Eyes: Conjunctiva non-injected. PERRL. Normal fundoscopic exam.  Cardio: RRR, no m/r/g noted.  Pulm: No increased WOB.  CTAB, without wheezes, rhonchi or crackles noted.  Skin: 1.3x1.3 firm, non-mobile cyst located on the right temple, no  warmth or drainage noted.   Assessment/Plan: Diabetes [facility]) Discussed that metformin is on the [facility] $4 list, gave pt a printed Rx.  Elevated BP without diagnosis of hypertension BP elevated today. Was in normal range at last visit. Asymptomatic. - if BP continues to be elevated, will start a need medication- lisinopril 2.5mg  and  are both on the [facility] $4 list.  Sebaceous cyst Exam and history consistent with sebaceous cyst. No evidence of infection. - pt examined with attending Dr. Randolm Idol; will f/u with me on 5/16 for I&D with his assistance. - pt to continue warm compresses twice daily.   No orders of the defined types were placed in this encounter.   Meds ordered this encounter  Medications  . metFORMIN (GLUCOPHAGE) 500 MG tablet    Sig: Take one tablet daily for one week and then increase to one tablet twice a day.    Dispense:  180 tablet    Refill:  1    Joanna Puff PGY-3, [facility]

## 2017-03-31 NOTE — Assessment & Plan Note (Signed)
BP elevated today. Was in normal range at last visit. Asymptomatic. - if BP continues to be elevated, will start a need medication- lisinopril 2.5mg  and  are both on the [facility] $4 list.

## 2017-03-31 NOTE — Assessment & Plan Note (Addendum)
Exam and history consistent with sebaceous cyst. No evidence of infection. - pt examined with attending Dr. Randolm Idol; will f/u with me on 5/16 for I&D with his assistance. - pt to continue warm compresses twice daily.

## 2017-03-31 NOTE — Assessment & Plan Note (Signed)
Discussed that metformin is on the [facility] $4 list, gave pt a printed Rx.

## 2017-03-31 NOTE — Patient Instructions (Signed)
Start using a warm compress on the area at least twice daily. I have scheduled you in my clinic for an incision and drainage on May 16th at 11:15, try to get here 10-15 minutes early so we can get you set up for the procedure.   I have written a prescription for metformin  twice daily, you can get a 90 day supply for $10. If your blood pressure is elevated at your next visit, we may need to discuss starting another medication.

## 2017-04-02 ENCOUNTER — Encounter: Payer: Self-pay | Admitting: Family Medicine

## 2017-04-02 ENCOUNTER — Ambulatory Visit (INDEPENDENT_AMBULATORY_CARE_PROVIDER_SITE_OTHER): Payer: Self-pay | Admitting: Family Medicine

## 2017-04-02 VITALS — BP 126/84 | HR 93 | Temp 98.9°F | Ht 64.0 in | Wt 175.0 lb

## 2017-04-02 DIAGNOSIS — E119 Type 2 diabetes mellitus without complications: Secondary | ICD-10-CM

## 2017-04-02 DIAGNOSIS — L723 Sebaceous cyst: Secondary | ICD-10-CM

## 2017-04-02 MED ORDER — METFORMIN HCL 500 MG PO TABS: ORAL_TABLET | ORAL | 1 refills | Status: DC

## 2017-04-02 MED ORDER — SULFAMETHOXAZOLE-TRIMETHOPRIM 800-160 MG PO TABS: 1.0000 | ORAL_TABLET | Freq: Two times a day (BID) | ORAL | 0 refills | Status: AC

## 2017-04-02 NOTE — Progress Notes (Signed)
    Subjective:    Patient ID: Vickie Gonzales, female    DOB: June 12, 1986, 31 y.o.   MRN: [id number]   CC: cyst on face worsening  Was seen a few days ago for the cyst on her face and is scheduled for I&D on 4/16 in derm clinic with Dr. Randolm IdolFletke and Dr. Leonides Schanzorsey. She is having more pain related to the cyst and this morning her eye was swollen due to the cyst. This has since improved but it concerned her. The cyst is tender to touch, has not drained, she is not trying to express material from it. Continues to use warm compress. Denies fevers. Denies changes in vision, red eye, or eye pain.   Smoking status reviewed- current every day smoker   Objective:  BP 126/84   Pulse 93   Temp 98.9 F (37.2 C) (Oral)   Ht 5\' 4"  (1.626 m)   Wt 175 lb (79.4 kg)   LMP 03/24/2017 (Approximate)   SpO2 98%   BMI 30.04 kg/m  Vitals and nursing note reviewed  General: well nourished, in no acute distress Skin: roughly 2 cm x 2 cm mobile cyst on right temple near eye, erythematous and tender to touch Neuro: alert and oriented, no focal deficits  Assessment & Plan:    Sebaceous cyst   -rx given for DS Bactrim to take for 7 days -return for I&D as scheduled on 5/16 -return precautions given  Diabetes [facility](HCC)  Patient states she has not picked up metformin yet due to it costing [facility] lot at [facility]  [facility] patient to try to pick this up today -may need different meter prescribed based on insurance coverage at next visit, follow up with PCP    Return if symptoms worsen or fail to improve.   Dolores PattyAngela Providencia Hottenstein, DO Family Medicine Resident PGY-1

## 2017-04-02 NOTE — Assessment & Plan Note (Addendum)
 -  rx given for DS Bactrim to take for 7 days -return for I&D as scheduled on 5/16 -return precautions given

## 2017-04-02 NOTE — Assessment & Plan Note (Signed)
  Patient states she has not picked up metformin yet due to it costing a lot at [facility]  [facility] patient to try to pick this up today -may need different meter prescribed based on insurance coverage at next visit, follow up with PCP

## 2017-04-02 NOTE — Patient Instructions (Signed)
Epidermal Cyst An epidermal cyst is a small, painless lump under your skin. It may be called an epidermal inclusion cyst or an infundibular cyst. The cyst contains a grayish-white, bad-smelling substance (keratin). It is important not to pop epidermal cysts yourself. These cysts are usually harmless (benign), but they can get infected. Symptoms of infection may include:  Redness.  Inflammation.  Tenderness.  Warmth.  Fever.  A grayish-white, bad-smelling substance draining from the cyst.  Pus draining from the cyst. Follow these instructions at home:  Take over-the-counter and prescription medicines only as told by your doctor.  If you were prescribed an antibiotic, use it as told by your doctor. Do not stop using the antibiotic even if you start to feel better.  Keep the area around your cyst clean and dry.  Wear loose, dry clothing.  Do not try to pop your cyst.  Avoid touching your cyst.  Check your cyst every day for signs of infection.  Keep all follow-up visits as told by your doctor. This is important. How is this prevented?  Wear clean, dry, clothing.  Avoid wearing tight clothing.  Keep your skin clean and dry. Shower or take baths every day.  Wash your body with a benzoyl peroxide wash when you shower or bathe. Contact a health care provider if:  Your cyst has symptoms of infection.  Your condition is not improving or is getting worse.  You have a cyst that looks different from other cysts you have had.  You have a fever. Get help right away if:  Redness spreads from the cyst into the surrounding area. This information is not intended to replace advice given to you by your health care provider. Make sure you discuss any questions you have with your health care provider. Document Released: 12/24/2004 Document Revised: 07/15/2016 Document Reviewed: 09/18/2015 Elsevier Interactive Patient Education  2017 Elsevier Inc.  

## 2017-04-14 ENCOUNTER — Ambulatory Visit: Payer: Medicaid Other | Admitting: Family Medicine

## 2017-04-21 ENCOUNTER — Encounter: Payer: Self-pay | Admitting: Internal Medicine

## 2017-04-21 ENCOUNTER — Ambulatory Visit (INDEPENDENT_AMBULATORY_CARE_PROVIDER_SITE_OTHER): Payer: 59 | Admitting: Internal Medicine

## 2017-04-21 ENCOUNTER — Ambulatory Visit: Payer: Self-pay | Admitting: Internal Medicine

## 2017-04-21 VITALS — BP 130/80 | HR 118 | Temp 98.0°F | Wt 177.2 lb

## 2017-04-21 DIAGNOSIS — L723 Sebaceous cyst: Secondary | ICD-10-CM

## 2017-04-21 DIAGNOSIS — E119 Type 2 diabetes mellitus without complications: Secondary | ICD-10-CM | POA: Diagnosis not present

## 2017-04-21 LAB — POCT GLYCOSYLATED HEMOGLOBIN (HGB A1C): Hemoglobin A1C: 9.8

## 2017-04-21 NOTE — Patient Instructions (Signed)
Make an appointment to have the sebaceous cyst removed at your convenience.

## 2017-04-21 NOTE — Progress Notes (Signed)
   Subjective:    Vickie Gonzales - 31 y.o. female MRN [id number]  Date of birth: Jun 08, 1986  HPI  Vickie Knowsbony L Cosens is here for follow up for facial sebaceous cyst. [facility]. Cyst has been present for >1 year. It intermittently comes and goes. Were planning for removal. She had an acute increase in pain as well as edema and was given an Rx for Bactrim. Patient reports she was unable to pick up the antibiotic but those symptoms have all resolved. She is interested in having it removed in the future during [facility]. Denies vision changes, fevers, and eye pain. Cyst has not been draining.    -  reports that she has been smoking Cigarettes.  She has been smoking about 0.25 packs per day. She uses smokeless tobacco. - Review of Systems: Per HPI. - Past Medical History: Patient Active Problem List   Diagnosis Date Noted  . Elevated BP without diagnosis of hypertension 03/31/2017  . Sebaceous cyst 03/31/2017  . Healthcare maintenance 10/01/2016  . Preeclampsia 10/02/2013  . Previous cesarean delivery, delivered, with or without mention of antepartum condition 06/05/2013  . History of chlamydia infection 01/31/2010  . TOBACCO USER 08/17/2009  . HYPERLIPIDEMIA 07/24/2008  . 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 Diabetes ([facility]) 01/27/2007  . OBESITY, NOS 01/27/2007   - Medications: reviewed and updated   Objective:   Physical Exam BP 130/80 (BP Location: Left Arm, Patient Position: Sitting, Cuff Size: Normal)   Pulse (!) 118   Temp 98 F (36.7 C) (Oral)   Wt 177 lb 3.2 oz (80.4 kg)   LMP 03/24/2017 (Approximate)   SpO2 97%   BMI 30.42 kg/m  Gen: NAD, alert, cooperative with exam, well-appearing Skin:  Roughly 2 cm by 2 cm mobile cyst on right temple near eye, non-erythematous, without edema, and not TTP  Assessment & Plan:   Sebaceous cyst Chronically present without any current acute signs of infection.  Recommended follow up with dermatology clinic for removal at patient's convenience. Return precautions that would warrant sooner evaluation were discussed.    Marcy Sirenatherine Gurtej Noyola, D.O. 04/21/2017, 4:11 PM PGY-2, [facility]

## 2017-05-15 ENCOUNTER — Emergency Department (HOSPITAL_COMMUNITY)
Admission: EM | Admit: 2017-05-15 | Discharge: 2017-05-15 | Disposition: A | Discharge status: Home / Self Care | Payer: 59 | Attending: Emergency Medicine | Admitting: Emergency Medicine

## 2017-05-15 ENCOUNTER — Encounter (HOSPITAL_COMMUNITY): Payer: Self-pay | Admitting: Emergency Medicine

## 2017-05-15 DIAGNOSIS — I10 Essential (primary) hypertension: Secondary | ICD-10-CM | POA: Insufficient documentation

## 2017-05-15 DIAGNOSIS — R739 Hyperglycemia, unspecified: Secondary | ICD-10-CM

## 2017-05-15 DIAGNOSIS — A599 Trichomoniasis, unspecified: Secondary | ICD-10-CM

## 2017-05-15 DIAGNOSIS — Z7984 Long term (current) use of oral hypoglycemic drugs: Secondary | ICD-10-CM | POA: Insufficient documentation

## 2017-05-15 DIAGNOSIS — F17210 Nicotine dependence, cigarettes, uncomplicated: Secondary | ICD-10-CM | POA: Diagnosis not present

## 2017-05-15 DIAGNOSIS — R1084 Generalized abdominal pain: Secondary | ICD-10-CM | POA: Diagnosis present

## 2017-05-15 DIAGNOSIS — R197 Diarrhea, unspecified: Secondary | ICD-10-CM

## 2017-05-15 DIAGNOSIS — R112 Nausea with vomiting, unspecified: Secondary | ICD-10-CM

## 2017-05-15 DIAGNOSIS — Z79899 Other long term (current) drug therapy: Secondary | ICD-10-CM | POA: Diagnosis not present

## 2017-05-15 DIAGNOSIS — E1165 Type 2 diabetes mellitus with hyperglycemia: Secondary | ICD-10-CM | POA: Insufficient documentation

## 2017-05-15 LAB — CBC WITH DIFFERENTIAL/PLATELET
BASOS PCT: 0 %
Basophils Absolute: 0 10*3/uL (ref 0.0–0.1)
EOS ABS: 0.2 10*3/uL (ref 0.0–0.7)
EOS PCT: 2 %
HCT: 41.8 % (ref 36.0–46.0)
Hemoglobin: 15 g/dL (ref 12.0–15.0)
Lymphocytes Relative: 26 %
Lymphs Abs: 3 10*3/uL (ref 0.7–4.0)
MCH: 33.3 pg (ref 26.0–34.0)
MCHC: 35.9 g/dL (ref 30.0–36.0)
MCV: 92.9 fL (ref 78.0–100.0)
Monocytes Absolute: 0.6 10*3/uL (ref 0.1–1.0)
Monocytes Relative: 5 %
Neutro Abs: 7.7 10*3/uL (ref 1.7–7.7)
Neutrophils Relative %: 67 %
PLATELETS: 367 10*3/uL (ref 150–400)
RBC: 4.5 MIL/uL (ref 3.87–5.11)
RDW: 13.2 % (ref 11.5–15.5)
WBC: 11.4 10*3/uL — ABNORMAL HIGH (ref 4.0–10.5)

## 2017-05-15 LAB — URINALYSIS, MICROSCOPIC (REFLEX): RBC / HPF: NONE SEEN RBC/hpf (ref 0–5)

## 2017-05-15 LAB — COMPREHENSIVE METABOLIC PANEL
ALK PHOS: 72 U/L (ref 38–126)
ALT: 13 U/L — AB (ref 14–54)
AST: 16 U/L (ref 15–41)
Albumin: 3.8 g/dL (ref 3.5–5.0)
Anion gap: 11 (ref 5–15)
BUN: 10 mg/dL (ref 6–20)
CHLORIDE: 104 mmol/L (ref 101–111)
CO2: 23 mmol/L (ref 22–32)
CREATININE: 0.67 mg/dL (ref 0.44–1.00)
Calcium: 8.9 mg/dL (ref 8.9–10.3)
Glucose, Bld: 345 mg/dL — ABNORMAL HIGH (ref 65–99)
Potassium: 4.1 mmol/L (ref 3.5–5.1)
Sodium: 138 mmol/L (ref 135–145)
Total Bilirubin: 0.6 mg/dL (ref 0.3–1.2)
Total Protein: 7.6 g/dL (ref 6.5–8.1)

## 2017-05-15 LAB — URINALYSIS, ROUTINE W REFLEX MICROSCOPIC
BILIRUBIN URINE: NEGATIVE
Glucose, UA: >=500 mg/dL — AB
Hgb urine dipstick: NEGATIVE
Leukocytes, UA: NEGATIVE
Nitrite: NEGATIVE
Protein, ur: NEGATIVE mg/dL
SPECIFIC GRAVITY, URINE: 1.02 (ref 1.005–1.030)
pH: 6 (ref 5.0–8.0)

## 2017-05-15 LAB — LIPASE, BLOOD: LIPASE: 26 U/L (ref 11–51)

## 2017-05-15 LAB — PREGNANCY, URINE: PREG TEST UR: NEGATIVE

## 2017-05-15 MED ORDER — METFORMIN HCL 500 MG PO TABS: 500.0000 mg | ORAL_TABLET | Freq: Two times a day (BID) | ORAL | 1 refills | Status: DC

## 2017-05-15 MED ORDER — ONDANSETRON 8 MG PO TBDP
8.0000 mg | ORAL_TABLET | Freq: Once | ORAL | Status: AC
  Administered 2017-05-15: 8 mg via ORAL
  Filled 2017-05-15: qty 1

## 2017-05-15 MED ORDER — METRONIDAZOLE 500 MG PO TABS: 500.0000 mg | ORAL_TABLET | Freq: Two times a day (BID) | ORAL | 0 refills | Status: DC

## 2017-05-15 NOTE — ED Triage Notes (Signed)
Patient complaining of waking up and having abdominal pain. Patient states that she has thrown up 3 times since midnight. She states that she is feeling nauseas. Patient states she is on birth control and has not been having sex.

## 2017-05-15 NOTE — ED Notes (Signed)
Called main lab to follow up on urine pregnancy test results.  Was told they would run test and have results shortly.

## 2017-05-15 NOTE — ED Provider Notes (Signed)
[facility] DEPT Provider Note   CSN: [id number] Arrival date & time: 05/15/17  2035     History   Chief Complaint Chief Complaint  Patient presents with  . Abdominal Pain    HPI Vickie Gonzales is a 31 y.o. female.  HPI Vickie Gonzales is a 31 y.o. female with history of diabetes, hypertension, acid reflux, presents to emergency department complaining of nausea, vomiting, diarrhea abdominal pain which started this morning. Patient states she has had 2 episodes of emesis, one episode of diarrhea. Reports upper abdominal cramping and burning. She states after she threw up her stomach felt better. She denies any back pain. Denies any urinary symptoms. No vaginal discharge or bleeding. Did not take any medications prior to coming in.   Past Medical History:  Diagnosis Date  . Diabetes mellitus    On insulin during pregnancy otherwise oral meds  . GERD (gastroesophageal reflux disease)   . Hypertension     Patient Active Problem List   Diagnosis Date Noted  . Elevated BP without diagnosis of hypertension 03/31/2017  . Sebaceous cyst 03/31/2017  . Healthcare maintenance 10/01/2016  . Preeclampsia 10/02/2013  . Previous cesarean delivery, delivered, with or without mention of antepartum condition 06/05/2013  . History of chlamydia infection 01/31/2010  . TOBACCO USER 08/17/2009  . HYPERLIPIDEMIA 07/24/2008  . 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 Diabetes ([place]) 01/27/2007  . OBESITY, NOS 01/27/2007    Past Surgical History:  Procedure Laterality Date  . CESAREAN SECTION    . CESAREAN SECTION N/A 10/03/2013   Procedure: CESAREAN SECTION;  Surgeon: Emily Filbert, MD;  Location: [facility];  Service: Obstetrics;  Laterality: N/A;    OB History    Gravida Para Term Preterm AB Living   _0 SAB TAB Ectopic Multiple Live Births     1     2       Home Medications    Prior to Admission medications   Medication Sig Start Date End Date Taking? Authorizing Provider    blood glucose meter kit and supplies KIT Dispense based on patient and insurance preference. Use up to four times daily as directed. (FOR ICD-9 250.00, 250.01). Patient not taking: Reported on 03/31/2017 02/11/17   Hedges, Dellis Filbert, PA-C  metFORMIN (GLUCOPHAGE) 500 MG tablet Take one tablet daily for one week and then increase to one tablet twice a day. 04/02/17   Steve Rattler, DO  norgestimate-ethinyl estradiol (ORTHO-CYCLEN,SPRINTEC,PREVIFEM) 0.25-35 MG-MCG tablet Take 1 tablet by mouth daily. Patient not taking: Reported on 03/31/2017 09/30/16   Lorna Few, DO  varenicline (CHANTIX) 0.5 MG tablet Take one tablet daily for three days and then increase to one tablet twice a day. Patient not taking: Reported on 02/11/2017 09/30/16   Lorna Few, DO    Family History Family History  Problem Relation Age of Onset  . Diabetes Mother   . Cancer Mother        breast cancer    Social History Social History  Substance Use Topics  . Smoking status: Current Every Day Smoker    Packs/day: 0.25    Types: Cigarettes  . Smokeless tobacco: Current User  . Alcohol use Yes     Comment: occasionally     Allergies   Patient has no known allergies.   Review of Systems Review of Systems  Constitutional: Negative for chills and fever.  Respiratory: Negative for cough, chest tightness and shortness of breath.  Cardiovascular: Negative for chest pain, palpitations and leg swelling.  Gastrointestinal: Positive for abdominal pain, diarrhea, nausea and vomiting.  Genitourinary: Negative for dysuria, flank pain, pelvic pain, vaginal bleeding, vaginal discharge and vaginal pain.  Musculoskeletal: Negative for arthralgias, myalgias, neck pain and neck stiffness.  Skin: Negative for rash.  Neurological: Negative for dizziness, weakness and headaches.  All other systems reviewed and are negative.    Physical Exam Updated Vital Signs BP (!) 152/95 (BP Location: Left Arm)   Pulse 98   Temp  97.8 F (36.6 C) (Oral)   Resp 18   Ht '5\' 2"'$  (1.575 m)   Wt 78 kg (172 lb)   SpO2 100%   BMI 31.46 kg/m   Physical Exam  Constitutional: She appears well-developed and well-nourished. No distress.  HENT:  Head: Normocephalic.  Eyes: Conjunctivae are normal.  Neck: Neck supple.  Cardiovascular: Normal rate, regular rhythm and normal heart sounds.   Pulmonary/Chest: Effort normal and breath sounds normal. No respiratory distress. She has no wheezes. She has no rales.  Abdominal: Soft. Bowel sounds are normal. She exhibits no distension. There is no tenderness. There is no rebound and no guarding.  Musculoskeletal: She exhibits no edema.  Neurological: She is alert.  Skin: Skin is warm and dry.  Psychiatric: She has a normal mood and affect. Her behavior is normal.  Nursing note and vitals reviewed.    ED Treatments / Results  Labs (all labs ordered are listed, but only abnormal results are displayed) Labs Reviewed  COMPREHENSIVE METABOLIC PANEL - Abnormal; Notable for the following:       Result Value   Glucose, Bld 345 (*)    ALT 13 (*)    All other components within normal limits  URINALYSIS, ROUTINE W REFLEX MICROSCOPIC - Abnormal; Notable for the following:    Glucose, UA >=500 (*)    Ketones, ur TRACE (*)    All other components within normal limits  CBC WITH DIFFERENTIAL/PLATELET - Abnormal; Notable for the following:    WBC 11.4 (*)    All other components within normal limits  URINALYSIS, MICROSCOPIC (REFLEX) - Abnormal; Notable for the following:    Bacteria, UA RARE (*)    Squamous Epithelial / LPF 0-5 (*)    All other components within normal limits  LIPASE, BLOOD  PREGNANCY, URINE    EKG  EKG Interpretation None       Radiology No results found.  Procedures Procedures (including critical care time)  Medications Ordered in ED Medications - No data to display   Initial Impression / Assessment and Plan / ED Course  I have reviewed the triage  vital signs and the nursing notes.  Pertinent labs & imaging results that were available during my care of the patient were reviewed by me and considered in my medical decision making (see chart for details).     Patient with nausea, vomiting, diarrhea that started this morning. Exam unremarkable, abdomen is benign and nontender. Will give Zofran for nausea and vomiting. Check labs and pregnancy test. Check urinalysis   8:18 AM Urine preg negative. Labs normal except for glucose of 345, wbc 11.4. Pt not taking her metformin, bc unable to afford. Explained it is 4 dollars at [employer], will give prescription. Pt's UA also positive for trichomonas. Will treat with flagyl. Advised to have partner treated as well. Pt tolerating POs, abdomen soft, non tender. Do not think pt needs any further imaging at this time. Home with pcp follow up.  Vitals:   05/15/17 0617  BP: (!) 152/95  Pulse: 98  Resp: 18  Temp: 97.8 F (36.6 C)  TempSrc: Oral  SpO2: 100%  Weight: 78 kg (172 lb)  Height: _0  (1.575 m)     Final Clinical Impressions(s) / ED Diagnoses   Final diagnoses:  Nausea vomiting and diarrhea  Infection due to trichomonas  Hyperglycemia    New Prescriptions New Prescriptions   METFORMIN (GLUCOPHAGE) 500 MG TABLET    Take 1 tablet (500 mg total) by mouth 2 (two) times daily.   METRONIDAZOLE (FLAGYL) 500 MG TABLET    Take 1 tablet (500 mg total) by mouth 2 (two) times daily.     Jeannett Senior, PA-C 05/15/17 [place], [place], DO 05/15/17 2307

## 2017-05-15 NOTE — Discharge Instructions (Signed)
Take metformin as prescribed daily for diabetes. Take flagyl for trichomonas infection. Follow up with family doctor as needed.

## 2017-07-19 ENCOUNTER — Ambulatory Visit: Payer: 59 | Admitting: Family Medicine

## 2017-07-26 ENCOUNTER — Ambulatory Visit: Payer: 59 | Admitting: Family Medicine

## 2017-08-23 ENCOUNTER — Other Ambulatory Visit: Payer: Self-pay | Admitting: *Deleted

## 2017-08-23 MED ORDER — NORGESTIMATE-ETH ESTRADIOL 0.25-35 MG-MCG PO TABS: 1.0000 | ORAL_TABLET | Freq: Every day | ORAL | 11 refills | Status: DC

## 2017-12-15 ENCOUNTER — Ambulatory Visit (INDEPENDENT_AMBULATORY_CARE_PROVIDER_SITE_OTHER): Payer: 59 | Admitting: Internal Medicine

## 2017-12-15 ENCOUNTER — Encounter: Payer: Self-pay | Admitting: Internal Medicine

## 2017-12-15 ENCOUNTER — Other Ambulatory Visit: Payer: Self-pay

## 2017-12-15 VITALS — BP 165/108 | HR 104 | Temp 98.1°F | Ht 64.0 in | Wt 161.8 lb

## 2017-12-15 DIAGNOSIS — N912 Amenorrhea, unspecified: Secondary | ICD-10-CM

## 2017-12-15 DIAGNOSIS — E119 Type 2 diabetes mellitus without complications: Secondary | ICD-10-CM | POA: Diagnosis not present

## 2017-12-15 DIAGNOSIS — R030 Elevated blood-pressure reading, without diagnosis of hypertension: Secondary | ICD-10-CM

## 2017-12-15 LAB — POCT URINE PREGNANCY: PREG TEST UR: NEGATIVE

## 2017-12-15 LAB — POCT GLYCOSYLATED HEMOGLOBIN (HGB A1C): HEMOGLOBIN A1C: 8.6

## 2017-12-15 MED ORDER — LISINOPRIL 5 MG PO TABS: 5.0000 mg | ORAL_TABLET | Freq: Every day | ORAL | 3 refills | Status: DC

## 2017-12-15 MED ORDER — METFORMIN HCL 500 MG PO TABS: 500.0000 mg | ORAL_TABLET | Freq: Two times a day (BID) | ORAL | 1 refills | Status: DC

## 2017-12-15 NOTE — Patient Instructions (Signed)
I have prescribed Lisinopril for your elevated blood pressure and to help protect your kidneys with your diabetes. I have also refilled your Metformin.

## 2017-12-15 NOTE — Progress Notes (Signed)
   Subjective:    Vickie Gonzales - 32 y.o. female MRN [id number]  Date of birth: 1986-01-23  HPI  Vickie Gonzales is here for SDA for "not feeling well". Brings up a variety of concerns that she has.   Amenorrhea: Reports no menstrual period since sometime in November. She is unsure of date. However, this seems self induced by skipping the sugar pills at the end of the OCP pack and starting next pack early. She has been doing this to intentionally avoid menstruating but became concerned she may be pregnant as she has missed a few pills. She denies nausea, breast tenderness or fatigue. Has been under stress lately at work and feels extra emotional.   T2DM: She reports being out of Metformin for several months. She has been eating less due to her new work schedule. Has not been exercising regularly. Denies polyuria or polydipsia.   Elevated BP: Concerned about her elevated blood pressures when she comes to the doctors office. Says they "are always high" at office visits. Talks about having high blood pressures with previous pregnancy. Denies ever having taken medications for hypertension. Denies chest pain, severe headaches, and vision changes.    -  reports that she has been smoking cigarettes.  She has been smoking about 0.25 packs per day. She uses smokeless tobacco. - Review of Systems: Per HPI. - Past Medical History: Patient Active Problem List   Diagnosis Date Noted  . Elevated BP without diagnosis of hypertension 03/31/2017  . Sebaceous cyst 03/31/2017  . Healthcare maintenance 10/01/2016  . Preeclampsia 10/02/2013  . Previous cesarean delivery, delivered, with or without mention of antepartum condition 06/05/2013  . History of chlamydia infection 01/31/2010  . TOBACCO USER 08/17/2009  . HYPERLIPIDEMIA 07/24/2008  . 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 Diabetes (HCC) 01/27/2007  . OBESITY, NOS 01/27/2007   - Medications: reviewed and updated   Objective:   Physical Exam BP (!)  165/108   Pulse (!) 104   Temp 98.1 F (36.7 C) (Oral)   Ht 5\' 4"  (1.626 m)   Wt 161 lb 12.8 oz (73.4 kg)   SpO2 97%   BMI 27.77 kg/m  Gen: NAD, alert, cooperative with exam, well-appearing CV: RRR Resp: CTABL, no wheezes, non-labored Psych: good insight, alert and oriented     Assessment & Plan:   1. Type 2 diabetes mellitus without complication, without long-term current use of insulin (HCC) A1c 8.6% from 9.8%. Patient reports that she has been out of her Metformin for quite some time. Interestingly enough, she has exhibited improvement in her glucose control despite medication non-compliance. This may be related to diet changes. Will refill Metformin today. Could likely benefit from increase in dose if tolerates from GI perspective. Needs follow up with PCP for further glucose control.  - POCT glycosylated hemoglobin (Hb A1C)  2. Amenorrhea Related to OCP use. Upreg negative. Discussed use of alarms to remind to take OCP daily on time.  - POCT urine pregnancy  3. Elevated BP without diagnosis of hypertension BP elevated today and has been elevated on several occasions in the past. Will start Lisinopril for anti-hypertensive effect as well as renal protection in setting of T2DM. Return in 1 week for BMET. Return in 2 weeks for follow up with Dr. Jonathon JordanGambino.   Marcy Sirenatherine Jazlyne Gauger, D.O. 12/15/2017, 2:34 PM PGY-3, [facility]

## 2017-12-15 NOTE — Assessment & Plan Note (Addendum)
A1c 8.6% from 9.8%. Patient reports that she has been out of her Metformin for quite some time. Interestingly enough, she has exhibited improvement in her glucose control despite medication non-compliance. Will refill today. Needs follow up with PCP for further glucose control.

## 2018-01-25 ENCOUNTER — Other Ambulatory Visit: Payer: Self-pay

## 2018-01-25 ENCOUNTER — Ambulatory Visit (INDEPENDENT_AMBULATORY_CARE_PROVIDER_SITE_OTHER): Payer: 59 | Admitting: Family Medicine

## 2018-01-25 ENCOUNTER — Encounter: Payer: Self-pay | Admitting: Family Medicine

## 2018-01-25 VITALS — BP 118/70 | HR 103 | Temp 97.8°F | Wt 159.0 lb

## 2018-01-25 DIAGNOSIS — K2970 Gastritis, unspecified, without bleeding: Secondary | ICD-10-CM

## 2018-01-25 NOTE — Patient Instructions (Signed)
Gastritis, Adult Gastritis is inflammation of the stomach. There are two kinds of gastritis:  Acute gastritis. This kind develops suddenly.  Chronic gastritis. This kind lasts for a long time.  Gastritis happens when the lining of the stomach becomes weak or gets damaged. Without treatment, gastritis can lead to stomach bleeding and ulcers. What are the causes? This condition may be caused by:  An infection.  Drinking too much alcohol.  Certain medicines.  Having too much acid in the stomach.  A disease of the intestines or stomach.  Stress.  What are the signs or symptoms? Symptoms of this condition include:  Pain or a burning in the upper abdomen.  Nausea.  Vomiting.  An uncomfortable feeling of fullness after eating.  In some cases, there are no symptoms. How is this diagnosed? This condition may be diagnosed with:  A description of your symptoms.  A physical exam.  Tests. These can include: ? Blood tests. ? Stool tests. ? A test in which a thin, flexible instrument with a light and camera on the end is passed down the esophagus and into the stomach (upper endoscopy). ? A test in which a sample of tissue is taken for testing (biopsy).  How is this treated? This condition may be treated with medicines. If the condition is caused by a bacterial infection, you may be given antibiotic medicines. If it is caused by too much acid in the stomach, you may get medicines called H2 blockers, proton pump inhibitors, or antacids. Treatment may also involve stopping the use of certain medicines, such as aspirin, ibuprofen, or other nonsteroidal anti-inflammatory drugs (NSAIDs). Follow these instructions at home:  Take over-the-counter and prescription medicines only as told by your health care provider.  If you were prescribed an antibiotic, take it as told by your health care provider. Do not stop taking the antibiotic even if you start to feel better.  Drink enough  fluid to keep your urine clear or pale yellow.  Eat small, frequent meals instead of large meals. Contact a health care provider if:  Your symptoms get worse.  Your symptoms return after treatment. Get help right away if:  You vomit blood or material that looks like coffee grounds.  You have black or dark red stools.  You are unable to keep fluids down.  Your abdominal pain gets worse.  You have a fever.  You do not feel better after 1 week. This information is not intended to replace advice given to you by your health care provider. Make sure you discuss any questions you have with your health care provider. Document Released: 11/10/2001 Document Revised: 07/15/2016 Document Reviewed: 08/10/2015 Elsevier Interactive Patient Education  2018 Elsevier Inc.  

## 2018-01-25 NOTE — Progress Notes (Signed)
   Subjective:    Patient ID: Vickie Gonzales, female    DOB: 10-29-1986, 32 y.o.   MRN: [id number]   CC: Viral gastritis  HPI: Patient is a 32 year old female who presents today needing a letter for work after she missed today due to stomach pain and emesis.  Patient reports that she was woken up this morning by abdominal pain and vomiting.  Patient reports that her children had a similar symptom last week and were diagnosed with viral gastritis that resolved after 3 days.  Patient denies any similar presentation in the past.  Patient has been trying to stay hydrated.  Patient reports her symptoms have essentially resolved, abdominal abdominal pain is much improved.  She denies any other episode of emesis.  Patient also denies any acute diarrhea, though she reports that she is currently taking metformin which has been causing a lot of diarrhea.  Patient is currently on her menstrual cycle is also on birth control.  She denies any chest pain, shortness of breath, headache, dizziness.  Smoking status reviewed   ROS: all other systems were reviewed and are negative other than in the HPI   Past Medical History:  Diagnosis Date  . Diabetes mellitus    On insulin during pregnancy otherwise oral meds  . GERD (gastroesophageal reflux disease)   . Hypertension     Past Surgical History:  Procedure Laterality Date  . CESAREAN SECTION    . CESAREAN SECTION N/A 10/03/2013   Procedure: CESAREAN SECTION;  Surgeon: Allie BossierMyra C Dove, MD;  Location: [facility];  Service: Obstetrics;  Laterality: N/A;    Past medical history, surgical, family, and social history reviewed and updated in the EMR as appropriate.  Objective:  BP 118/70   Pulse (!) 103   Temp 97.8 F (36.6 C) (Oral)   Wt 159 lb (72.1 kg)   LMP 01/18/2018 (Approximate)   SpO2 95%   BMI 27.29 kg/m   Vitals and nursing note reviewed  General: NAD, pleasant, able to participate in exam Cardiac: RRR, normal heart sounds, no murmurs. 2+ radial  and PT pulses bilaterally Respiratory: CTAB, normal effort, No wheezes, rales or rhonchi Abdomen: soft, nontender, nondistended, no hepatic or splenomegaly, +BS Extremities: no edema or cyanosis. WWP. Skin: warm and dry, no rashes noted Neuro: alert and oriented x4, no focal deficits Psych: Normal affect and mood   Assessment & Plan:   #Abdominal pain, emesis, acute, resolving Patient is symptomatic consistent with likely viral gastritis in the setting of sick contacts.  Symptoms are self resolving.  Patient currently has menstrual cycle in addition to being on birth control making pregnancy low likelihood.  Patient is essentially here today for a work excuse note which will be provided.  No concerning symptoms at the moment.  Recommended plenty of hydration and slow return to regular diet. Patient will follow-up in clinic if symptoms worsen or do not improve in the next few days.   Lovena NeighboursAbdoulaye Nhi Butrum, MD [facility] PGY-2

## 2018-01-28 ENCOUNTER — Telehealth: Payer: Self-pay | Admitting: Family Medicine

## 2018-01-28 NOTE — Telephone Encounter (Signed)
She was written out of work through 01/27/18 but she is still feeling very bad.  She would like to know if she can get another letter stating she can be out of work longer, until at least 01/31/18? So, she will need to pick it up this morning.  Please let her know if this is possible this morning at [phone].

## 2018-01-31 NOTE — Telephone Encounter (Signed)
Pt calling back about this dr note. She would like a new note stating she is out through today, 01-31-2018. Let her know when this updated letter is ready to be picked up.

## 2018-02-02 NOTE — Telephone Encounter (Signed)
I think we took care of it on Monday if I'm not mistaken. Thanks for keeping me posted.  Lovena NeighboursAbdoulaye Nicholaus Steinke, MD [facility], PGY-2

## 2018-02-24 ENCOUNTER — Other Ambulatory Visit: Payer: Self-pay | Admitting: Internal Medicine

## 2018-02-24 DIAGNOSIS — E119 Type 2 diabetes mellitus without complications: Secondary | ICD-10-CM

## 2018-04-29 ENCOUNTER — Ambulatory Visit: Payer: 59 | Admitting: Family Medicine

## 2018-05-11 ENCOUNTER — Ambulatory Visit: Payer: 59 | Admitting: Family Medicine

## 2018-05-21 ENCOUNTER — Other Ambulatory Visit: Payer: Self-pay | Admitting: Family Medicine

## 2018-10-26 ENCOUNTER — Ambulatory Visit: Payer: 59 | Admitting: Family Medicine

## 2018-11-02 ENCOUNTER — Ambulatory Visit (INDEPENDENT_AMBULATORY_CARE_PROVIDER_SITE_OTHER): Payer: 59 | Admitting: Family Medicine

## 2018-11-02 ENCOUNTER — Other Ambulatory Visit: Payer: Self-pay

## 2018-11-02 ENCOUNTER — Encounter: Payer: Self-pay | Admitting: Family Medicine

## 2018-11-02 VITALS — BP 144/94 | HR 111 | Temp 98.1°F | Ht 64.0 in | Wt 163.2 lb

## 2018-11-02 DIAGNOSIS — F439 Reaction to severe stress, unspecified: Secondary | ICD-10-CM

## 2018-11-02 DIAGNOSIS — E119 Type 2 diabetes mellitus without complications: Secondary | ICD-10-CM | POA: Diagnosis not present

## 2018-11-02 LAB — POCT GLYCOSYLATED HEMOGLOBIN (HGB A1C): HBA1C, POC (CONTROLLED DIABETIC RANGE): 9.6 % — AB (ref 0.0–7.0)

## 2018-11-02 MED ORDER — METFORMIN HCL ER 500 MG PO TB24: 500.0000 mg | ORAL_TABLET | Freq: Every day | ORAL | 0 refills | Status: DC

## 2018-11-02 NOTE — Progress Notes (Signed)
Subjective:  Vickie Gonzales is a 32 y.o. female who presents to the [facility] today with a chief complaint of stress.   HPI:  Patient states that she has been under a lot of stress lately due to certain things in her life. It is causing her to feel very worried and down. She has been unable to take care of herself and feels like her diabetes control is suffering as a result. She has not been taking her medications or eating like she should. Her metformin gave her diarrhea so she is not interested in resuming anyways. She has never tried ER. She is worried because of how it is impacting her ability to work and take care of her 2 children. She denies SI or HI as she "would never do anything to hurt myself". She denies every having episodes of mania or increased wakefulness. She denies feeling down for no reason or guilty for no reason. She states that she just needs a break from this stressful situation and thinks time away from work would be helpful. Ideally she would like 4 weeks off. She is not interested in any medications but would be willing to go to counseling. No hallucinations.     ROS: Per HPI  Social Hx: She reports that she has been smoking cigarettes. She has been smoking about 0.25 packs per day. She uses smokeless tobacco. She reports that she drinks alcohol. She reports that she does not use drugs.    Objective:  Physical Exam: BP (!) 144/94   Pulse (!) 111   Temp 98.1 F (36.7 C) (Oral)   Ht 5\' 4"  (1.626 m)   Wt 163 lb 4 oz (74 kg)   LMP 10/19/2018   SpO2 96%   BMI 28.02 kg/m   Gen: NAD, resting comfortably Pulm: NWOB Neuro: grossly normal, moves all extremities Psych: Normal affect and thought content without tearfulness. Normal speech.  Results for orders placed or performed in visit on 11/02/18 (from the past 72 hour(s))  HgB A1c     Status: Abnormal   Collection Time: 11/02/18  4:24 PM  Result Value Ref Range   Hemoglobin A1C     HbA1c POC (<> result, manual  entry)     HbA1c, POC (prediabetic range)     HbA1c, POC (controlled diabetic range) 9.6 (A) 0.0 - 7.0 %    Depression screen [facility] 2/9 11/02/2018 11/02/2018 12/15/2017 04/21/2017 04/02/2017  Decreased Interest 3 1 0 0 0  Down, Depressed, Hopeless 3 1 1  0 0  PHQ - 2 Score 6 2 1  0 0  Altered sleeping 3 - - - -  Tired, decreased energy 3 - - - -  Change in appetite 3 - - - -  Feeling bad or failure about yourself  3 - - - -  Trouble concentrating 2 - - - -  Moving slowly or fidgety/restless 2 - - - -  Suicidal thoughts 1 - - - -  PHQ-9 Score 23 - - - -  Difficult doing work/chores Extremely dIfficult - - - -   GAD 7 : Generalized Anxiety Score 11/02/2018  Nervous, Anxious, on Edge 1  Control/stop worrying 2  Worry too much - different things 3  Trouble relaxing 1  Restless 1  Easily annoyed or irritable 2  Afraid - awful might happen 1  Total GAD 7 Score 11  Anxiety Difficulty Extremely difficult     Assessment/Plan:  Stress Patient PHQ9 score 23 which is certainly concerning  for depression. However during visit today she continuously alluded to a stressful life situation causing her mood issues which may be more consistent with adjustment disorder. As patient was not forthcoming about her situation, was unable to fully diagnose her today. She perseverated on her request to be out of work for 4 weeks at this time. Recommended full psychiatric evaluation. Discussed possibility of medication which patient declined. Recommended counseling, patient agreeable and given list of resources. Close follow up in 2 weeks to ensure patient was able to establish with counseling and to address uncontrolled diabetes.   Diabetes [facility](HCC) Patient agreeable to trial of extended release metformin to see if she can better tolerate. Follow up in 2 weeks.   Leland HerElsia J Adelayde Minney, DO PGY-3, [facility] 11/02/2018 4:34 PM

## 2018-11-02 NOTE — Patient Instructions (Signed)
Start metformin extended release 500mg  daily.   Call and make counseling appointment.   Referral to psychiatry placed today. Someone should call you with scheduling.

## 2018-11-03 DIAGNOSIS — F439 Reaction to severe stress, unspecified: Secondary | ICD-10-CM | POA: Insufficient documentation

## 2018-11-03 NOTE — Assessment & Plan Note (Signed)
Patient PHQ9 score 23 which is certainly concerning for depression. However during visit today she continuously alluded to a stressful life situation causing her mood issues which may be more consistent with adjustment disorder. As patient was not forthcoming about her situation, was unable to fully diagnose her today. She perseverated on her request to be out of work for 4 weeks at this time. Recommended full psychiatric evaluation. Discussed possibility of medication which patient declined. Recommended counseling, patient agreeable and given list of resources. Close follow up in 2 weeks to ensure patient was able to establish with counseling and to address uncontrolled diabetes.

## 2018-11-03 NOTE — Assessment & Plan Note (Signed)
Patient agreeable to trial of extended release metformin to see if she can better tolerate. Follow up in 2 weeks.

## 2019-03-06 ENCOUNTER — Other Ambulatory Visit: Payer: Self-pay

## 2019-03-06 MED ORDER — NORGESTIMATE-ETH ESTRADIOL 0.25-35 MG-MCG PO TABS: 1.0000 | ORAL_TABLET | Freq: Every day | ORAL | 11 refills | Status: DC

## 2019-05-27 ENCOUNTER — Emergency Department (HOSPITAL_COMMUNITY): Payer: 59

## 2019-05-27 ENCOUNTER — Emergency Department (HOSPITAL_COMMUNITY)
Admission: EM | Admit: 2019-05-27 | Discharge: 2019-05-27 | Disposition: A | Discharge status: Home / Self Care | Payer: 59 | Attending: Emergency Medicine | Admitting: Emergency Medicine

## 2019-05-27 ENCOUNTER — Other Ambulatory Visit: Payer: Self-pay

## 2019-05-27 DIAGNOSIS — F17210 Nicotine dependence, cigarettes, uncomplicated: Secondary | ICD-10-CM | POA: Insufficient documentation

## 2019-05-27 DIAGNOSIS — R739 Hyperglycemia, unspecified: Secondary | ICD-10-CM

## 2019-05-27 DIAGNOSIS — Z79899 Other long term (current) drug therapy: Secondary | ICD-10-CM | POA: Insufficient documentation

## 2019-05-27 DIAGNOSIS — N3000 Acute cystitis without hematuria: Secondary | ICD-10-CM

## 2019-05-27 DIAGNOSIS — N3090 Cystitis, unspecified without hematuria: Secondary | ICD-10-CM | POA: Insufficient documentation

## 2019-05-27 DIAGNOSIS — E1165 Type 2 diabetes mellitus with hyperglycemia: Secondary | ICD-10-CM | POA: Diagnosis not present

## 2019-05-27 DIAGNOSIS — I10 Essential (primary) hypertension: Secondary | ICD-10-CM | POA: Diagnosis not present

## 2019-05-27 DIAGNOSIS — R531 Weakness: Secondary | ICD-10-CM | POA: Diagnosis present

## 2019-05-27 LAB — URINALYSIS, ROUTINE W REFLEX MICROSCOPIC
Bilirubin Urine: NEGATIVE
Glucose, UA: >=500 mg/dL — AB
Hgb urine dipstick: NEGATIVE
Ketones, ur: 20 mg/dL — AB
Nitrite: NEGATIVE
Protein, ur: NEGATIVE mg/dL
Specific Gravity, Urine: 1.037 — ABNORMAL HIGH (ref 1.005–1.030)
WBC, UA: >50 WBC/hpf — ABNORMAL HIGH (ref 0–5)
pH: 6 (ref 5.0–8.0)

## 2019-05-27 LAB — BASIC METABOLIC PANEL
Anion gap: 13 (ref 5–15)
BUN: 6 mg/dL (ref 6–20)
CO2: 21 mmol/L — ABNORMAL LOW (ref 22–32)
Calcium: 8.8 mg/dL — ABNORMAL LOW (ref 8.9–10.3)
Chloride: 101 mmol/L (ref 98–111)
Creatinine, Ser: 0.82 mg/dL (ref 0.44–1.00)
GFR calc Af Amer: >60 mL/min (ref >60)
GFR calc non Af Amer: >60 mL/min (ref >60)
Glucose, Bld: 337 mg/dL — ABNORMAL HIGH (ref 70–99)
Potassium: 2.9 mmol/L — ABNORMAL LOW (ref 3.5–5.1)
Sodium: 135 mmol/L (ref 135–145)

## 2019-05-27 LAB — TROPONIN I (HIGH SENSITIVITY)
Troponin I (High Sensitivity): 66 ng/L — ABNORMAL HIGH (ref <18)
Troponin I (High Sensitivity): 66 ng/L — ABNORMAL HIGH (ref <18)

## 2019-05-27 LAB — CBC WITH DIFFERENTIAL/PLATELET
Abs Immature Granulocytes: 0.07 10*3/uL (ref 0.00–0.07)
Basophils Absolute: 0 10*3/uL (ref 0.0–0.1)
Basophils Relative: 0 %
Eosinophils Absolute: 0 10*3/uL (ref 0.0–0.5)
Eosinophils Relative: 0 %
HCT: 39.9 % (ref 36.0–46.0)
Hemoglobin: 13.7 g/dL (ref 12.0–15.0)
Immature Granulocytes: 0 %
Lymphocytes Relative: 16 %
Lymphs Abs: 2.5 10*3/uL (ref 0.7–4.0)
MCH: 33.5 pg (ref 26.0–34.0)
MCHC: 34.3 g/dL (ref 30.0–36.0)
MCV: 97.6 fL (ref 80.0–100.0)
Monocytes Absolute: 0.7 10*3/uL (ref 0.1–1.0)
Monocytes Relative: 4 %
Neutro Abs: 12.9 10*3/uL — ABNORMAL HIGH (ref 1.7–7.7)
Neutrophils Relative %: 80 %
Platelets: 329 10*3/uL (ref 150–400)
RBC: 4.09 MIL/uL (ref 3.87–5.11)
RDW: 12 % (ref 11.5–15.5)
WBC: 16.3 10*3/uL — ABNORMAL HIGH (ref 4.0–10.5)
nRBC: 0 % (ref 0.0–0.2)

## 2019-05-27 LAB — CBG MONITORING, ED: Glucose-Capillary: 340 mg/dL — ABNORMAL HIGH (ref 70–99)

## 2019-05-27 MED ORDER — AMLODIPINE BESYLATE 5 MG PO TABS
5.0000 mg | ORAL_TABLET | Freq: Once | ORAL | Status: AC
  Administered 2019-05-27: 5 mg via ORAL
  Filled 2019-05-27: qty 1

## 2019-05-27 MED ORDER — CEPHALEXIN 500 MG PO CAPS: 500.0000 mg | ORAL_CAPSULE | Freq: Two times a day (BID) | ORAL | 0 refills | Status: DC

## 2019-05-27 MED ORDER — POTASSIUM CHLORIDE CRYS ER 20 MEQ PO TBCR
40.0000 meq | EXTENDED_RELEASE_TABLET | Freq: Once | ORAL | Status: AC
  Administered 2019-05-27: 04:00:00 40 meq via ORAL
  Filled 2019-05-27: qty 2

## 2019-05-27 MED ORDER — ONDANSETRON HCL 4 MG/2ML IJ SOLN
4.0000 mg | Freq: Once | INTRAMUSCULAR | Status: AC
  Administered 2019-05-27: 4 mg via INTRAVENOUS
  Filled 2019-05-27: qty 2

## 2019-05-27 MED ORDER — SODIUM CHLORIDE 0.9 % IV BOLUS
1000.0000 mL | Freq: Once | INTRAVENOUS | Status: AC
  Administered 2019-05-27: 1000 mL via INTRAVENOUS

## 2019-05-27 MED ORDER — AMLODIPINE BESYLATE 5 MG PO TABS: 5.0000 mg | ORAL_TABLET | Freq: Every day | ORAL | 0 refills | Status: DC

## 2019-05-27 NOTE — ED Provider Notes (Signed)
[facility] EMERGENCY DEPARTMENT Provider Note   CSN: [id number] Arrival date & time: 05/27/19  0021     History   Chief Complaint Chief Complaint  Patient presents with   Weakness   Hyperglycemia   Hypertension    HPI Vickie Gonzales is a 33 y.o. female.     The history is provided by the patient and medical records.  Weakness Associated symptoms: arthralgias, nausea and vomiting   Hyperglycemia Associated symptoms: fatigue, nausea and vomiting   Hypertension     33 year old female with history of diabetes, GERD, hypertension, presenting to the ED with multiple concerns.  Patient states that recently she feels she is just under too much stress.  States there have been several issues going on within her family and other relatives that are causing her bloating as well as currently being out of work due to COVID-19.  States this is causing some financial burden on herself and her children whom she cares for independently.  She reports poor oral intake and poor sleep as she has not been feeling quite like herself.  States tonight she started feeling very overwhelmed and had a "odd" sensation in her left arm/shoulder area which was concerning to her.  States she does not think she was having any chest pain but did feel little short of breath.  States those symptoms have resolved at this time.  Her blood pressure was noted to be high, states she tends to run a little high.  BP medications were stopped at prior appointment and was not restarted on anything else.  She is not having any headache, dizziness, confusion, numbness, or weakness.  Lastly, patient's glucose was elevated with EMS.  She thinks this is because she drank some Kool-Aid just prior to their arrival.  Glucose remains elevated on fingerstick here at 340.  This is a little higher than her baseline.  States she has intermittently been taking her metformin due to GI upset.  She does report some urinary  frequency and some very mild dysuria.  She denies any pelvic pain or vaginal discharge.  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 Authorizing Provider  blood glucose meter kit and supplies KIT Dispense based on patient and insurance preference. Use up to four times daily as directed. (FOR ICD-9 250.00, 250.01). Patient not taking: Reported on 03/31/2017 02/11/17   Hedges, Dellis Filbert, PA-C  lisinopril (PRINIVIL,ZESTRIL) 5 MG tablet Take 1 tablet (5 mg total) by mouth daily. 12/15/17   Nicolette Bang, DO  metFORMIN (GLUCOPHAGE-XR) 500 MG 24 hr tablet Take 1 tablet (500 mg total) by mouth daily with breakfast. 11/02/18   Bufford Lope, DO  metroNIDAZOLE (FLAGYL) 500 MG tablet Take 1 tablet (500 mg total) by mouth 2 (two) times daily. 05/15/17    Kirichenko, Lahoma Rocker, PA-C  norgestimate-ethinyl estradiol (SPRINTEC 28) 0.25-35 MG-MCG tablet Take 1 tablet by mouth daily. 03/06/19   Bufford Lope, DO  varenicline (CHANTIX) 0.5 MG tablet Take one tablet daily for three days and then increase to one tablet twice a day. Patient not taking: Reported on 02/11/2017 09/30/16   Lorna Few, DO    Family History Family History  Problem Relation Age of Onset   Diabetes Mother    Cancer Mother        breast cancer    Social History Social History   Tobacco Use   Smoking status: Current Every Day Smoker    Packs/day: 0.25    Types: Cigarettes   Smokeless tobacco: Current User  Substance Use Topics   Alcohol use: Yes    Comment: occasionally   Drug use: No     Allergies   Patient has no known allergies.   Review of Systems Review of Systems  Constitutional: Positive for appetite change and fatigue.  Gastrointestinal: Positive for nausea and vomiting.  Musculoskeletal: Positive for arthralgias.  All other systems reviewed and are negative.    Physical Exam Updated Vital Signs BP (!) 171/105    Pulse 88    Temp 98.1 F (36.7 C) (Oral)    Resp 19    Ht '5\' 4"'  (1.626 m)    Wt 73.9 kg    SpO2 99%    BMI 27.98 kg/m   Physical Exam Vitals signs and nursing note reviewed.  Constitutional:      Appearance: She is well-developed.     Comments: Crying when entering room but is able to be redirected, shortly after began vomiting  HENT:     Head: Normocephalic and atraumatic.  Eyes:     Conjunctiva/sclera: Conjunctivae normal.     Pupils: Pupils are equal, round, and reactive to light.  Neck:     Musculoskeletal: Normal range of motion.  Cardiovascular:     Rate and Rhythm: Normal rate and regular rhythm.     Heart sounds: Normal heart sounds.  Pulmonary:     Effort: Pulmonary effort is normal.     Breath sounds: Normal breath sounds. No wheezing, rhonchi or rales.     Comments: Dry cough, lungs are  clear Abdominal:     General: Bowel sounds are normal.     Palpations: Abdomen is soft.     Tenderness: There is no abdominal tenderness. There is no guarding or rebound.     Comments: Soft, nontender  Musculoskeletal: Normal range of motion.     Comments: Left arm normal in appearance without swelling or overlying skin changes, normal motor function and sensation  Skin:    General: Skin is warm and dry.  Neurological:     Mental Status:  She is alert and oriented to person, place, and time.     Comments: AAOx3, answering questions and following commands appropriately; equal strength UE and LE bilaterally; CN grossly intact; moves all extremities appropriately without ataxia; no focal neuro deficits or facial asymmetry appreciated      ED Treatments / Results  Labs (all labs ordered are listed, but only abnormal results are displayed) Labs Reviewed  CBC WITH DIFFERENTIAL/PLATELET - Abnormal; Notable for the following components:      Result Value   WBC 16.3 (*)    Neutro Abs 12.9 (*)    All other components within normal limits  BASIC METABOLIC PANEL - Abnormal; Notable for the following components:   Potassium 2.9 (*)    CO2 21 (*)    Glucose, Bld 337 (*)    Calcium 8.8 (*)    All other components within normal limits  URINALYSIS, ROUTINE W REFLEX MICROSCOPIC - Abnormal; Notable for the following components:   APPearance HAZY (*)    Specific Gravity, Urine 1.037 (*)    Glucose, UA >=500 (*)    Ketones, ur 20 (*)    Leukocytes,Ua MODERATE (*)    WBC, UA >50 (*)    Bacteria, UA RARE (*)    All other components within normal limits  TROPONIN I (HIGH SENSITIVITY) - Abnormal; Notable for the following components:   Troponin I (High Sensitivity) 66 (*)    All other components within normal limits  TROPONIN I (HIGH SENSITIVITY) - Abnormal; Notable for the following components:   Troponin I (High Sensitivity) 66 (*)    All other components within normal limits  CBG MONITORING,  ED - Abnormal; Notable for the following components:   Glucose-Capillary 340 (*)    All other components within normal limits    EKG EKG Interpretation  Date/Time:  Saturday May 27 2019 01:24:19 EDT Ventricular Rate:  94 PR Interval:    QRS Duration: 93 QT Interval:  401 QTC Calculation: 502 R Axis:   127 Text Interpretation:  Right and left arm electrode reversal, interpretation assumes no reversal Sinus rhythm Probable left atrial enlargement Right axis deviation Abnormal T, consider ischemia, lateral leads Prolonged QT interval arm electrodes reversed No significant change was found Confirmed by Ezequiel Essex [phone]) on 05/27/2019 1:34:08 AM   Radiology Dg Chest 2 View  Result Date: 05/27/2019 CLINICAL DATA:  33 year old female with hypertension and left arm pain. EXAM: CHEST - 2 VIEW COMPARISON:  None. FINDINGS: The heart size and mediastinal contours are within normal limits. Both lungs are clear. The visualized skeletal structures are unremarkable. IMPRESSION: No active cardiopulmonary disease. Electronically Signed   By: Anner Crete M.D.   On: 05/27/2019 02:24    Procedures Procedures (including critical care time)  Medications Ordered in ED Medications  sodium chloride 0.9 % bolus 1,000 mL (0 mLs Intravenous Stopped 05/27/19 0358)  ondansetron (ZOFRAN) injection 4 mg (4 mg Intravenous Given 05/27/19 0138)  amLODipine (NORVASC) tablet 5 mg (5 mg Oral Given 05/27/19 0423)  potassium chloride SA (K-DUR) CR tablet 40 mEq (40 mEq Oral Given 05/27/19 0423)     Initial Impression / Assessment and Plan / ED Course  I have reviewed the triage vital signs and the nursing notes.  Pertinent labs & imaging results that were available during my care of the patient were reviewed by me and considered in my medical decision making (see chart for details).  33 year old female here with multiple concerns.  She had odd sensation in her left  arm, elevated blood pressure, and elevated  glucose.  Ultimately patient states majority of her symptoms are due to stress.  States there is been a lot going on in it does not sound like she has been taking care of herself quite as well as she should.  It seems she has intermittently been taking her metformin due to GI upset, she was also taken off her blood pressure medications previously but was not restarted on another medication yet.  By time of my evaluation, symptoms have resolved.  She is awake, alert, appropriately oriented.  No focal neurologic deficits.  Patient is tearful when talking to her, after being allowed to vent for a few minutes she states she feels significantly better.  She did vomit initially after assessment but thinks this is because she got upset.  Her initial CBG here is elevated, she does admit to drinking Kool-Aid at home.  I do not feel that her symptoms today represent TIA.  Plan for screening labs, urinalysis, chest x-ray.  Given IV fluids and Zofran.  Patient remains hypertensive, will give dose of Norvasc.  Will monitor.  Patient's labs as above.  She does have leukocytosis and hypokalemia as well as marginally elevated troponin.  Patient remains without any chest pain or left arm pain at this time.  States she is feeling significantly better after IV fluids and Zofran.  We will plan to monitor here and obtain delta troponin.  If remaining flat or downtrending feel discharge would be appropriate, if uptrending will admit.  Delta troponin is unchanged.  Patient remains asymptomatic here and continues feeling well.  Potassium was repleted.  Urinalysis has come back and does appear somewhat infected with >50 WBC's and moderate leuks.  Given she is having urinary frequency and mild dysuria, will treat with course of Keflex.  BP has also improved, now 168/97.  Feel patient is stable for discharge home.  No findings suggestive of endorgan damage.  Will prescribe Norvasc for home.  I recommended that she follow-up closely with  her primary care doctor for ongoing monitoring of her glucose as well as her blood pressure.  Return here for any new or acute changes.  Final Clinical Impressions(s) / ED Diagnoses   Final diagnoses:  Essential hypertension  Hyperglycemia  Acute cystitis without hematuria    ED Discharge Orders         Ordered    amLODipine (NORVASC) 5 MG tablet  Daily     05/27/19 0619    cephALEXin (KEFLEX) 500 MG capsule  2 times daily     05/27/19 Mack, [place], PA-C 05/27/19 0628    Ward, Delice Bison, DO 05/27/19 [phone]

## 2019-05-27 NOTE — Discharge Instructions (Signed)
Take the prescribed medication as directed.  Monitor sugar intake, drink plenty of water. Follow-up with your primary care doctor for re-check of glucose and blood pressure. Return to the ED for new or worsening symptoms.

## 2019-05-27 NOTE — ED Triage Notes (Signed)
Pt coming by EMS after developing an "odd" sensation in left arm at 2300. Initial HR 130s, BP 260/140, glucose 369. Pt states feeling in arm went away in route but then she began feeling very fatigued instead. Hx of DM (uncontrolled), HTN. Negative stroke screen. BP decreased to 191/99

## 2020-02-13 ENCOUNTER — Ambulatory Visit (INDEPENDENT_AMBULATORY_CARE_PROVIDER_SITE_OTHER): Payer: Self-pay | Admitting: Family Medicine

## 2020-02-13 ENCOUNTER — Encounter: Payer: Self-pay | Admitting: Family Medicine

## 2020-02-13 ENCOUNTER — Other Ambulatory Visit: Payer: Self-pay

## 2020-02-13 VITALS — BP 132/90 | Wt 151.1 lb

## 2020-02-13 DIAGNOSIS — F419 Anxiety disorder, unspecified: Secondary | ICD-10-CM

## 2020-02-13 DIAGNOSIS — I10 Essential (primary) hypertension: Secondary | ICD-10-CM

## 2020-02-13 MED ORDER — CITALOPRAM HYDROBROMIDE 10 MG PO TABS: 10.0000 mg | ORAL_TABLET | Freq: Every day | ORAL | 0 refills | Status: DC

## 2020-02-13 MED ORDER — AMLODIPINE BESYLATE 5 MG PO TABS: 5.0000 mg | ORAL_TABLET | Freq: Every day | ORAL | 1 refills | Status: DC

## 2020-02-13 NOTE — Patient Instructions (Signed)
Outpatient Mental Health Providers (No Insurance at time of Visit or Self Pay)   [facility] ([facility]) walk in M-F 8am-12pm and  1pm-3pm [place]  [place]  Phone: [phone]  [employer] Mon-Fri, 8:30-5:00  [address] [place], [place] [address]    [phone] [email or web address]  [phone] (Immediate assistance)  [facility]    Walk-in Mon-Fri, 8am-3pm [address] [place], [place]  [email or web address] (Mental Health and substance challenges) [address], [place] [phone]    [email or web address]    [facility] [place], [place]     Phone:  [phone] [place]  [phone]         [facility] Walk-in MWF 12:30 to 3:00     [place] [address]  [phone]  [email or web address] call to schedule an appointment    [place] ,Support group, Peer support services, [address] [place], [place] [address] 336- [phone]  [email or web address]           [facility] on Mental Illness (NAMI) [place]- Wellness classes, Support groups        [address], [place], [place] [address] [phone]   [email or web address]  [facility]  (Psycho-social Rehabilitation clubhouse, Individual and group therapy) [address] [place] [address], [place] [address]   [phone]  24- Hour Availability:  *[place] or [phone] * [facility] [employer] (Domestic Violence, Rape, etc. )[phone] [place] [phone] or [phone] * [facility] [place] [facility] [phone] (after hours) *[facility] [phone] *[place] National Suicide Hotline (810)321-9200 (TALK)   Citalopram tablets What  is this medicine? CITALOPRAM (sye TAL oh pram) is a medicine for depression. This medicine may be used for other purposes; ask your health care provider or pharmacist if you have questions. COMMON BRAND NAME(S): Celexa What should I tell my health care provider before I take this medicine? They need to know if you have any of these conditions:  bleeding disorders  bipolar disorder or a family history of bipolar disorder  glaucoma  heart disease  history of irregular heartbeat  kidney disease  liver disease  low levels of magnesium or potassium in the blood  receiving electroconvulsive therapy  seizures  suicidal thoughts, plans, or attempt; a previous suicide attempt by you or a family member  take medicines that treat or prevent blood clots  thyroid disease  an unusual or allergic reaction to citalopram, escitalopram, other medicines, foods, dyes, or preservatives  pregnant or trying to become pregnant  breast-feeding How should I use this medicine? Take this medicine by mouth with a glass of water. Follow the directions on the prescription label. You can take it with or without food. Take your medicine at regular intervals. Do not take your medicine more often than directed. Do not stop taking this medicine suddenly except upon the advice of your doctor. Stopping this medicine too quickly may cause serious side effects or your condition may worsen. A special MedGuide will be given to you by the pharmacist with each prescription and refill. Be sure to read this information carefully each time. Talk to your pediatrician regarding the use of this medicine in children. Special  care may be needed. Patients over 71 years old may have a stronger reaction and need a smaller dose. Overdosage: If you think you have taken too much of this medicine contact a poison control center or emergency room at once. NOTE: This medicine is only for you. Do not share this medicine with  others. What if I miss a dose? If you miss a dose, take it as soon as you can. If it is almost time for your next dose, take only that dose. Do not take double or extra doses. What may interact with this medicine? Do not take this medicine with any of the following medications:  certain medicines for fungal infections like fluconazole, itraconazole, ketoconazole, posaconazole, voriconazole  cisapride  dronedarone  escitalopram  linezolid  MAOIs like Carbex, Eldepryl, Marplan, Nardil, and Parnate  methylene blue (injected into a vein)  pimozide  thioridazine This medicine may also interact with the following medications:  alcohol  amphetamines  aspirin and aspirin-like medicines  carbamazepine  certain medicines for depression, anxiety, or psychotic disturbances  certain medicines for infections like chloroquine, clarithromycin, erythromycin, furazolidone, isoniazid, pentamidine  certain medicines for migraine headaches like almotriptan, eletriptan, frovatriptan, naratriptan, rizatriptan, sumatriptan, zolmitriptan  certain medicines for sleep  certain medicines that treat or prevent blood clots like dalteparin, enoxaparin, warfarin  cimetidine  diuretics  dofetilide  fentanyl  lithium  methadone  metoprolol  NSAIDs, medicines for pain and inflammation, like ibuprofen or naproxen  omeprazole  other medicines that prolong the QT interval (cause an abnormal heart rhythm)  procarbazine  rasagiline  supplements like St. John's wort, kava kava, valerian  tramadol  tryptophan  ziprasidone This list may not describe all possible interactions. Give your health care provider a list of all the medicines, herbs, non-prescription drugs, or dietary supplements you use. Also tell them if you smoke, drink alcohol, or use illegal drugs. Some items may interact with your medicine. What should I watch for while using this medicine? Tell your doctor if your  symptoms do not get better or if they get worse. Visit your doctor or health care professional for regular checks on your progress. Because it may take several weeks to see the full effects of this medicine, it is important to continue your treatment as prescribed by your doctor. Patients and their families should watch out for new or worsening thoughts of suicide or depression. Also watch out for sudden changes in feelings such as feeling anxious, agitated, panicky, irritable, hostile, aggressive, impulsive, severely restless, overly excited and hyperactive, or not being able to sleep. If this happens, especially at the beginning of treatment or after a change in dose, call your health care professional. Bonita Quin may get drowsy or dizzy. Do not drive, use machinery, or do anything that needs mental alertness until you know how this medicine affects you. Do not stand or sit up quickly, especially if you are an older patient. This reduces the risk of dizzy or fainting spells. Alcohol may interfere with the effect of this medicine. Avoid alcoholic drinks. Your mouth may get dry. Chewing sugarless gum or sucking hard candy, and drinking plenty of water will help. Contact your doctor if the problem does not go away or is severe. What side effects may I notice from receiving this medicine? Side effects that you should report to your doctor or health care professional as soon as possible:  allergic reactions like skin rash, itching or hives, swelling of the face, lips, or tongue  anxious  black, tarry  stools  breathing problems  changes in vision  chest pain  confusion  elevated mood, decreased need for sleep, racing thoughts, impulsive behavior  eye pain  fast, irregular heartbeat  feeling faint or lightheaded, falls  feeling agitated, angry, or irritable  hallucination, loss of contact with reality  loss of balance or coordination  loss of memory  painful or prolonged  erections  restlessness, pacing, inability to keep still  seizures  stiff muscles  suicidal thoughts or other mood changes  trouble sleeping  unusual bleeding or bruising  unusually weak or tired  vomiting Side effects that usually do not require medical attention (report to your doctor or health care professional if they continue or are bothersome):  change in appetite or weight  change in sex drive or performance  dizziness  headache  increased sweating  indigestion, nausea  tremors This list may not describe all possible side effects. Call your doctor for medical advice about side effects. You may report side effects to FDA at 1-800-FDA-1088. Where should I keep my medicine? Keep out of reach of children. Store at room temperature between 15 and 30 degrees C (59 and 86 degrees F). Throw away any unused medicine after the expiration date. NOTE: This sheet is a summary. It may not cover all possible information. If you have questions about this medicine, talk to your doctor, pharmacist, or health care provider.  2020 Elsevier/Gold Standard (2018-11-07 09:05:36)

## 2020-02-13 NOTE — Progress Notes (Signed)
    SUBJECTIVE:   CHIEF COMPLAINT / HPI:   Anxiety/panic: started 2-3 weeks ago.  She has had anxiety attacks prior to this, starting last december.  Episodes are currently happening every other day.describes it as a  Tightness in her chest and racing heartbeat. Has a cousin with it. She states she doesn't understand why they are happening now because she has been through worse things before and wasn't as anxious then. She lost her job due to covid but is not in danger of losing her home or being able to have food or pay her bills. Also worried about her 32 yr old w/ hearing disabilities going back to school soon.  But other than that she says nothing is really worrying her.   HTN: pt was on medication for HTN previously but not compliant due to cost and loss of insurance.  Discussed with pt if she could afford medication from $4 list on [facility] and she stated she could.   PERTINENT  PMH / PSH: diabetes  OBJECTIVE:   BP 132/90   Wt 151 lb 2 oz (68.5 kg)   BMI 25.94 kg/m   Gen: alert and oriented. No acute distress.  CV: RRR. No murmurs.  2+ radial pulse b/l Pulm: LCTAB. No respiratory distress.  Psych: good eye contact.  Spontaneous speech. Pt appears to become more anxious when discussing her symptoms/episodes  ASSESSMENT/PLAN:   Anxiety Appears to have elements of both GAD and panic disorder as she described both an increase in her baseline level of anxiety as well as discrete episodes of panic attacks with physical signs including tachycardia, sob, chest tightness.  EHR indicates pt has had difficulty coping with stressful life situations in the past.  Had a PHQ9 of 23 in 2019 but did not want medication at that time.  Pt willing to use medication now.   - start citalopram - gave pt information on therapy resources - f/u in 4 weeks  Hypertension Has multiple BP in the past in the hypertensive range.  Today diastolic is 90.  Pt was noncompliant due to cost prior to this. Pt states  she can afford $4/month for antihypertensive medications.   - start amlodipine 5mg      , MD [facility]

## 2020-02-15 DIAGNOSIS — F410 Panic disorder [episodic paroxysmal anxiety] without agoraphobia: Secondary | ICD-10-CM | POA: Insufficient documentation

## 2020-02-15 NOTE — Assessment & Plan Note (Signed)
Appears to have elements of both GAD and panic disorder as she described both an increase in her baseline level of anxiety as well as discrete episodes of panic attacks with physical signs including tachycardia, sob, chest tightness.  EHR indicates pt has had difficulty coping with stressful life situations in the past.  Had a PHQ9 of 23 in 2019 but did not want medication at that time.  Pt willing to use medication now.   - start citalopram - gave pt information on therapy resources - f/u in 4 weeks

## 2020-02-15 NOTE — Assessment & Plan Note (Signed)
Has multiple BP in the past in the hypertensive range.  Today diastolic is 90.  Pt was noncompliant due to cost prior to this. Pt states she can afford $4/month for antihypertensive medications.   - start amlodipine 5mg 

## 2020-02-18 ENCOUNTER — Other Ambulatory Visit: Payer: Self-pay

## 2020-02-18 ENCOUNTER — Emergency Department (HOSPITAL_COMMUNITY)
Admission: EM | Admit: 2020-02-18 | Discharge: 2020-02-18 | Disposition: A | Discharge status: Home / Self Care | Payer: Self-pay | Attending: Emergency Medicine | Admitting: Emergency Medicine

## 2020-02-18 ENCOUNTER — Emergency Department (HOSPITAL_COMMUNITY): Payer: Self-pay

## 2020-02-18 ENCOUNTER — Encounter (HOSPITAL_COMMUNITY): Payer: Self-pay | Admitting: Emergency Medicine

## 2020-02-18 DIAGNOSIS — R079 Chest pain, unspecified: Secondary | ICD-10-CM | POA: Insufficient documentation

## 2020-02-18 DIAGNOSIS — Z7984 Long term (current) use of oral hypoglycemic drugs: Secondary | ICD-10-CM | POA: Insufficient documentation

## 2020-02-18 DIAGNOSIS — I10 Essential (primary) hypertension: Secondary | ICD-10-CM | POA: Insufficient documentation

## 2020-02-18 DIAGNOSIS — E119 Type 2 diabetes mellitus without complications: Secondary | ICD-10-CM | POA: Insufficient documentation

## 2020-02-18 DIAGNOSIS — Z79899 Other long term (current) drug therapy: Secondary | ICD-10-CM | POA: Insufficient documentation

## 2020-02-18 LAB — COMPREHENSIVE METABOLIC PANEL
ALT: 23 U/L (ref 0–44)
AST: 20 U/L (ref 15–41)
Albumin: 4.2 g/dL (ref 3.5–5.0)
Alkaline Phosphatase: 66 U/L (ref 38–126)
Anion gap: 9 (ref 5–15)
BUN: 6 mg/dL (ref 6–20)
CO2: 23 mmol/L (ref 22–32)
Calcium: 8.9 mg/dL (ref 8.9–10.3)
Chloride: 107 mmol/L (ref 98–111)
Creatinine, Ser: 0.61 mg/dL (ref 0.44–1.00)
GFR calc Af Amer: >60 mL/min (ref >60)
GFR calc non Af Amer: >60 mL/min (ref >60)
Glucose, Bld: 180 mg/dL — ABNORMAL HIGH (ref 70–99)
Potassium: 3.5 mmol/L (ref 3.5–5.1)
Sodium: 139 mmol/L (ref 135–145)
Total Bilirubin: 1.3 mg/dL — ABNORMAL HIGH (ref 0.3–1.2)
Total Protein: 7.4 g/dL (ref 6.5–8.1)

## 2020-02-18 LAB — CBC WITH DIFFERENTIAL/PLATELET
Abs Immature Granulocytes: 0.04 10*3/uL (ref 0.00–0.07)
Basophils Absolute: 0 10*3/uL (ref 0.0–0.1)
Basophils Relative: 0 %
Eosinophils Absolute: 0 10*3/uL (ref 0.0–0.5)
Eosinophils Relative: 0 %
HCT: 41.9 % (ref 36.0–46.0)
Hemoglobin: 14 g/dL (ref 12.0–15.0)
Immature Granulocytes: 0 %
Lymphocytes Relative: 19 %
Lymphs Abs: 2.4 10*3/uL (ref 0.7–4.0)
MCH: 33.7 pg (ref 26.0–34.0)
MCHC: 33.4 g/dL (ref 30.0–36.0)
MCV: 101 fL — ABNORMAL HIGH (ref 80.0–100.0)
Monocytes Absolute: 0.8 10*3/uL (ref 0.1–1.0)
Monocytes Relative: 6 %
Neutro Abs: 9.6 10*3/uL — ABNORMAL HIGH (ref 1.7–7.7)
Neutrophils Relative %: 75 %
Platelets: 372 10*3/uL (ref 150–400)
RBC: 4.15 MIL/uL (ref 3.87–5.11)
RDW: 12.6 % (ref 11.5–15.5)
WBC: 12.8 10*3/uL — ABNORMAL HIGH (ref 4.0–10.5)
nRBC: 0 % (ref 0.0–0.2)

## 2020-02-18 LAB — URINALYSIS, ROUTINE W REFLEX MICROSCOPIC
Bilirubin Urine: NEGATIVE
Glucose, UA: 50 mg/dL — AB
Hgb urine dipstick: NEGATIVE
Ketones, ur: 80 mg/dL — AB
Nitrite: NEGATIVE
Protein, ur: NEGATIVE mg/dL
Specific Gravity, Urine: 1.018 (ref 1.005–1.030)
pH: 8 (ref 5.0–8.0)

## 2020-02-18 LAB — CBG MONITORING, ED: Glucose-Capillary: 190 mg/dL — ABNORMAL HIGH (ref 70–99)

## 2020-02-18 LAB — TROPONIN I (HIGH SENSITIVITY): Troponin I (High Sensitivity): 4 ng/L (ref <18)

## 2020-02-18 LAB — I-STAT BETA HCG BLOOD, ED (MC, WL, AP ONLY): I-stat hCG, quantitative: <5 m[IU]/mL (ref <5)

## 2020-02-18 MED ORDER — SODIUM CHLORIDE 0.9 % IV BOLUS
1000.0000 mL | Freq: Once | INTRAVENOUS | Status: AC
  Administered 2020-02-18: 1000 mL via INTRAVENOUS

## 2020-02-18 NOTE — ED Triage Notes (Signed)
Patient brought in by EMS from home c/o weakness started yesterday. Patient stated she was been anxious  and unable to sleep recently. Patient stating she's been taking anti anxiety medication but no relief. Patient a/ox4. Patient denies fever.

## 2020-02-18 NOTE — ED Provider Notes (Signed)
[facility] DEPT Provider Note   CSN: [id number] Arrival date & time: 02/18/20  1933     History Chief Complaint  Patient presents with  . Weakness    Vickie Gonzales is a 34 y.o. female.  HPI 34 year old female presents with anxiety and weakness.  She endorses multiple symptoms.  Has been feeling poorly all day since she woke up.  Has been having what she calls anxiety and panic attacks for several days.  Tonight she took both her blood pressure medicine and an anxiety medicine that she does not know the name of.  Has not taken BP meds prior to today despite being prescribed them.  She endorses chest tightness, shortness of breath, lightheadedness.  No abdominal pain, vomiting, headache.  No cough.  Feels like she has panic attack but does not feel suicidal.   Past Medical History:  Diagnosis Date  . Diabetes mellitus    On insulin during pregnancy otherwise oral meds  . GERD (gastroesophageal reflux disease)   . Hypertension     Patient Active Problem List   Diagnosis Date Noted  . Anxiety 02/15/2020  . Stress 11/03/2018  . Hypertension 03/31/2017  . Healthcare maintenance 10/01/2016  . TOBACCO USER 08/17/2009  . HYPERLIPIDEMIA 07/24/2008  . 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 Diabetes ([place]) 01/27/2007  . OBESITY, NOS 01/27/2007    Past Surgical History:  Procedure Laterality Date  . CESAREAN SECTION    . CESAREAN SECTION N/A 10/03/2013   Procedure: CESAREAN SECTION;  Surgeon: Emily Filbert, MD;  Location: [facility];  Service: Obstetrics;  Laterality: N/A;     OB History    Gravida  3   Para  2   Term  1   Preterm  1   AB  1   Living  2     SAB      TAB  1   Ectopic      Multiple      Live Births  2           Family History  Problem Relation Age of Onset  . Diabetes Mother   . Cancer Mother        breast cancer    Social History   Tobacco Use  . Smoking status: Current Every Day Smoker    Packs/day: 0.25      Types: Cigarettes  . Smokeless tobacco: Current User  Substance Use Topics  . Alcohol use: Yes    Comment: occasionally  . Drug use: No    Home Medications Prior to Admission medications   Medication Sig Start Date End Date Taking? Authorizing Provider  amLODipine (NORVASC) 5 MG tablet Take 1 tablet (5 mg total) by mouth daily. 02/13/20  Yes Benay Pike, MD  blood glucose meter kit and supplies KIT Dispense based on patient and insurance preference. Use up to four times daily as directed. (FOR ICD-9 250.00, 250.01). 02/11/17  Yes Hedges, Dellis Filbert, PA-C  citalopram (CELEXA) 10 MG tablet Take 1 tablet (10 mg total) by mouth daily. 02/13/20  Yes Benay Pike, MD  Multiple Vitamin (MULTIVITAMIN WITH MINERALS) TABS tablet Take 1 tablet by mouth daily.   Yes [provider]  cephALEXin (KEFLEX) 500 MG capsule Take 1 capsule (500 mg total) by mouth 2 (two) times daily. Patient not taking: Reported on 02/18/2020 05/27/19   Larene Pickett, PA-C  metFORMIN (GLUCOPHAGE-XR) 500 MG 24 hr tablet Take 1 tablet (500 mg total) by mouth daily with breakfast.  Patient not taking: Reported on 05/27/2019 11/02/18   Bufford Lope, DO  metroNIDAZOLE (FLAGYL) 500 MG tablet Take 1 tablet (500 mg total) by mouth 2 (two) times daily. Patient not taking: Reported on 05/27/2019 05/15/17   Jeannett Senior, PA-C  norgestimate-ethinyl estradiol (SPRINTEC 28) 0.25-35 MG-MCG tablet Take 1 tablet by mouth daily. Patient not taking: Reported on 02/18/2020 03/06/19   Bufford Lope, DO    Allergies    Patient has no known allergies.  Review of Systems   Review of Systems  Constitutional: Negative for fever.  Respiratory: Positive for chest tightness and shortness of breath.   Gastrointestinal: Negative for abdominal pain, diarrhea and vomiting.  Neurological: Positive for light-headedness. Negative for headaches.  All other systems reviewed and are negative.   Physical Exam Updated Vital Signs BP (!)  176/98 (BP Location: Right Arm)   Pulse 87   Temp 98 F (36.7 C) (Oral)   Resp 16   LMP 02/12/2020 (Approximate)   SpO2 100%   Physical Exam Vitals and nursing note reviewed.  Constitutional:      Appearance: She is well-developed.  HENT:     Head: Normocephalic and atraumatic.     Right Ear: External ear normal.     Left Ear: External ear normal.     Nose: Nose normal.  Eyes:     General:        Right eye: No discharge.        Left eye: No discharge.     Extraocular Movements: Extraocular movements intact.     Pupils: Pupils are equal, round, and reactive to light.  Cardiovascular:     Rate and Rhythm: Normal rate and regular rhythm.     Heart sounds: Normal heart sounds.  Pulmonary:     Effort: Pulmonary effort is normal.     Breath sounds: Normal breath sounds.  Abdominal:     Palpations: Abdomen is soft.     Tenderness: There is no abdominal tenderness.  Skin:    General: Skin is warm and dry.  Neurological:     Mental Status: She is alert.     Comments: CN 3-12 grossly intact. 5/5 strength in all 4 extremities. Grossly normal sensation. Normal finger to nose.   Psychiatric:        Mood and Affect: Mood is not anxious.        Thought Content: Thought content does not include suicidal ideation.     ED Results / Procedures / Treatments   Labs (all labs ordered are listed, but only abnormal results are displayed) Labs Reviewed  COMPREHENSIVE METABOLIC PANEL - Abnormal; Notable for the following components:      Result Value   Glucose, Bld 180 (*)    Total Bilirubin 1.3 (*)    All other components within normal limits  CBC WITH DIFFERENTIAL/PLATELET - Abnormal; Notable for the following components:   WBC 12.8 (*)    MCV 101.0 (*)    Neutro Abs 9.6 (*)    All other components within normal limits  URINALYSIS, ROUTINE W REFLEX MICROSCOPIC - Abnormal; Notable for the following components:   Glucose, UA 50 (*)    Ketones, ur 80 (*)    Leukocytes,Ua SMALL (*)     Bacteria, UA RARE (*)    All other components within normal limits  CBG MONITORING, ED - Abnormal; Notable for the following components:   Glucose-Capillary 190 (*)    All other components within normal limits  URINE CULTURE  I-STAT BETA HCG BLOOD, ED ([facility], [facility], [facility] ONLY)  TROPONIN I (HIGH SENSITIVITY)    EKG EKG Interpretation  Date/Time:  Sunday February 18 2020 20:31:36 EDT Ventricular Rate:  85 PR Interval:    QRS Duration: 94 QT Interval:  401 QTC Calculation: 477 R Axis:   54 Text Interpretation: Sinus arrhythmia Probable left ventricular hypertrophy Borderline prolonged QT interval no acute ST/T changes no significant change since June 2020 Confirmed by Sherwood Gambler [id number]) on 02/18/2020 9:02:20 PM   Radiology DG Chest Portable 1 View  Result Date: 02/18/2020 CLINICAL DATA:  Chest tightness, dyspnea EXAM: PORTABLE CHEST 1 VIEW COMPARISON:  05/27/2019 FINDINGS: The heart size and mediastinal contours are within normal limits. Both lungs are clear. No pleural effusion or pneumothorax. The visualized skeletal structures are unremarkable. IMPRESSION: No acute process in the chest. Electronically Signed   By: Macy Mis M.D.   On: 02/18/2020 20:37    Procedures Procedures (including critical care time)  Medications Ordered in ED Medications  sodium chloride 0.9 % bolus 1,000 mL (0 mLs Intravenous Stopped 02/18/20 2125)    ED Course  I have reviewed the triage vital signs and the nursing notes.  Pertinent labs & imaging results that were available during my care of the patient were reviewed by me and considered in my medical decision making (see chart for details).    MDM Rules/Calculators/A&P                      Patient with multiple vague complaints.  Labs are reassuring.  Urine with some leukocytes in it but she has no UTI symptoms.  Given she is a diabetic I will send for culture but with no symptoms I do not think she needs antibiotics.  Troponin is negative at  4.  She has been having this vague chest pain on and off for days.  Offered second troponin but she declines.  Suspicion of acute stroke, PE, ACS, dissection is pretty low.  Exam is unremarkable.  Will discharge home with return precautions and follow-up with PCP. Final Clinical Impression(s) / ED Diagnoses Final diagnoses:  Nonspecific chest pain  Hypertension, unspecified type    Rx / DC Orders ED Discharge Orders    None       Sherwood Gambler, MD 02/19/20 [phone]

## 2020-02-18 NOTE — Discharge Instructions (Signed)
If you develop recurrent, continued, or worsening chest pain, shortness of breath, fever, vomiting, abdominal or back pain, or any other new/concerning symptoms then return to the ER for evaluation.  

## 2020-02-18 NOTE — ED Notes (Signed)
Pt. CBG 190, RN, Junior made aware.

## 2020-02-19 ENCOUNTER — Ambulatory Visit: Payer: Medicaid Other

## 2020-02-20 ENCOUNTER — Other Ambulatory Visit: Payer: Self-pay

## 2020-02-20 ENCOUNTER — Encounter: Payer: Self-pay | Admitting: Family Medicine

## 2020-02-20 ENCOUNTER — Ambulatory Visit (INDEPENDENT_AMBULATORY_CARE_PROVIDER_SITE_OTHER): Payer: Self-pay | Admitting: Family Medicine

## 2020-02-20 DIAGNOSIS — F410 Panic disorder [episodic paroxysmal anxiety] without agoraphobia: Secondary | ICD-10-CM

## 2020-02-20 LAB — URINE CULTURE: Culture: <10000 — AB

## 2020-02-20 MED ORDER — PROPRANOLOL HCL 20 MG PO TABS: 20.0000 mg | ORAL_TABLET | Freq: Three times a day (TID) | ORAL | 0 refills | Status: DC

## 2020-02-20 MED ORDER — ESCITALOPRAM OXALATE 10 MG PO TABS: 10.0000 mg | ORAL_TABLET | Freq: Every day | ORAL | 0 refills | Status: DC

## 2020-02-20 NOTE — Patient Instructions (Addendum)
It was great meeting you today!  I think that you are having generalized anxiety disorder with some panic attacks.  For the panic attacks I think having a short acting medication to help with those will be very helpful.  That medication will be called propanolol 20 mg 3 times daily.  I think switching you from the Celexa to Lexapro will be a good idea given your side effects.  The Lexapro tends to work quite well for this issue but is more of a maintenance therapy to prevent these.  Please note it can take several weeks for this to work, but you have to take it every single day to get the best benefit.  Lastly I think eating set with counseling is a good idea.  Please go to [employer].  This is a great website with a lot of tools to really filter who you are looking for as far as a counselor goes.  I think you will get tremendous benefit from the counseling.

## 2020-02-22 ENCOUNTER — Encounter: Payer: Self-pay | Admitting: Family Medicine

## 2020-02-22 NOTE — Assessment & Plan Note (Addendum)
Patient's symptoms seem to follow a category of headache disorder as she seems to be doing well when she is not having panic attacks.  Given her hesitancy about the Celexa I will switch her to Lexapro.  I believe 10 mg of Lexapro is likely good to be more beneficial than the much lower dose of 10 mg of Celexa anyways.  Given that her symptoms appear to manifest in panic attacks we will start her on propranolol 20 mg tablet 3 times a day.  I also provided her with the psychology today website so that she can find her own therapist as she expressed interest in therapy.  I would like to see her back either with myself or PCP in a couple weeks to see things are going, she is to let me know if she is having any side effects by telephone call in a few days. -Stop Celexa 10 mg -Start Lexapro 10 mg, 5 mg for the first couple of days -Start propanolol 20 mg 3 times a day -Resources given to find counseling. -Follow-up by telephone call in a couple of days -Follow-up with an actual visit in a few weeks

## 2020-02-22 NOTE — Progress Notes (Signed)
   CHIEF COMPLAINT / HPI: 34 year old female who presents for anxiety follow-up.  The patient was seen on 02/13/2020 in clinic for generalized anxiety disorder.  She was started on Celexa 10 mg.  He states that since that time she has not been doing very well.  She has had multiple episodes of shortness of breath, fast breathing, and chest pain.  She has admitted the emergency department a couple times for this, both times been diagnosed with anxiety disorder, follow-up PCP.  States that she has only taken the Celexa 1 total time since the symptoms started because she had trouble picking it up and she is afraid of side effects.  States that the side effects she got from the 1 dose are nausea, vomiting and possible "flushing".  The patient tends to have a lot of anxiety around herself and her children.  She states that she is the only 1 that can take care of them and that she is afraid of something what happened to her nobody can take care of her or her children she states that when she starts thinking about this she cannot stop thinking about it and its no loss into a full-blown panic attack.  GAD-7 and PHQ-9 very elevated with 3 through most answers aside from any kind of thoughts of self-harm.  PERTINENT  PMH / PSH:   OBJECTIVE: BP 130/90   Pulse (!) 115 Comment: provider informed  Ht 5\' 4"  (1.626 m)   Wt 148 lb 4 oz (67.2 kg)   LMP 02/12/2020   SpO2 97%   BMI 25.45 kg/m   Gen: 34 year old African-American female, no acute distress, comfortable CV: Tachycardic, no M/R/G Resp: No wheezing, nonlabored respiration Neuro: Alert and oriented, Speech clear, No gross deficits  Psych: Pleasant, coherent thought process.  No hurried speech, normal affect.  ASSESSMENT / PLAN:  Panic disorder Patient's symptoms seem to follow a category of headache disorder as she seems to be doing well when she is not having panic attacks.  Given her hesitancy about the Celexa I will switch her to Lexapro.  I  believe 10 mg of Lexapro is likely good to be more beneficial than the much lower dose of 10 mg of Celexa anyways.  Given that her symptoms appear to manifest in panic attacks we will start her on propranolol 20 mg tablet 3 times a day.  I also provided her with the psychology today website so that she can find her own therapist as she expressed interest in therapy.  I would like to see her back either with myself or PCP in a couple weeks to see things are going, she is to let me know if she is having any side effects by telephone call in a few days. -Stop Celexa 10 mg -Start Lexapro 10 mg, 5 mg for the first couple of days -Start propanolol 20 mg 3 times a day -Resources given to find counseling. -Follow-up by telephone call in a couple of days -Follow-up with an actual visit in a few weeks     38, MD [facility]

## 2020-03-12 ENCOUNTER — Encounter: Payer: Self-pay | Admitting: Family Medicine

## 2020-03-12 ENCOUNTER — Ambulatory Visit (INDEPENDENT_AMBULATORY_CARE_PROVIDER_SITE_OTHER): Payer: Self-pay | Admitting: Family Medicine

## 2020-03-12 ENCOUNTER — Other Ambulatory Visit: Payer: Self-pay

## 2020-03-12 VITALS — BP 122/88 | HR 95 | Ht 64.0 in | Wt 148.0 lb

## 2020-03-12 DIAGNOSIS — M545 Low back pain, unspecified: Secondary | ICD-10-CM

## 2020-03-12 DIAGNOSIS — E119 Type 2 diabetes mellitus without complications: Secondary | ICD-10-CM

## 2020-03-12 DIAGNOSIS — F410 Panic disorder [episodic paroxysmal anxiety] without agoraphobia: Secondary | ICD-10-CM

## 2020-03-12 LAB — POCT GLYCOSYLATED HEMOGLOBIN (HGB A1C): HbA1c, POC (controlled diabetic range): 7.4 % — AB (ref 0.0–7.0)

## 2020-03-12 NOTE — Progress Notes (Signed)
    SUBJECTIVE:   CHIEF COMPLAINT / HPI:    Vickie Gonzales is a 34 year old female who presents today for an anxiety follow-up  Anxiety/panic disorder  Recently seen in clinic for panic attacks. Initially was started on Celexa 10mg  on 02/13/2020 but this was discontinued due to side effects of shortness of breath and chest pain. Was then switched to Lexapro 10mg  and propranol 20mg  TID at last visit on 02/22/20. Was also given resources for counseling. Anxiety symptoms have now improved since taking the Lexapro and propanolol.  Does sometimes feel "weird feelings in my chest ". 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  One of her children wears hearing aids and she finds this particularly stressful. She also finds their schooling stressful too. She has experienced panic attacks in the car on the road trip with her children.  Sometimes also gets anxiety at night too.  Back pain Endorses right-sided lumbar back pain for the last 7 days.  First noticed this after she coughed last week and felt a " pulling sensation" in her lumbar spine.  She had a similar episode in the summer last year where she had coughed and felt like something had moved in her back. The pain is a throbbing pain, like her muscles are sore.  Movement exacerbates the pain. 7/10 severity. No radiation. A friend told her that " Tylenol arthritis" would help her pain which she bought and started taking.  This has been help to ease her pain.  Denies fevers, urinary or bowel incontinence, numbness or paresthesia or heavy lifting.  PERTINENT  PMH / PSH: Anxiety, diabetes, hypertension  OBJECTIVE:   BP 122/88   Pulse 95   Ht 5\' 4"  (1.626 m)   Wt 67.1 kg   LMP 02/06/2020 (Approximate)   SpO2 99%   BMI 25.40 kg/m    General: Well-appearing 34 year old female, moderate distress due to pain Cardio: Normal S1 and S2, RRR. No murmurs or rubs.   Pulm: CTAB, normal WOB  Abdomen: Bowel sounds normal.  Abdomen soft and non-tender.  Extremities: No peripheral edema. Warm/ well perfused.  Strong radial pulse.  Antalgic gait. 5/5 strength in right RE, unable to determine strength in LE due to pain. Patient reported numbness in LE during examination but normal sensation bilaterally.  No saddle anesthesia Neuro: Cranial nerves grossly intact  ASSESSMENT/PLAN:   Back pain Likely musculoskeletal back pain trigger by sudden movement.  Other important differentials considered were cauda equina, osteomyelitis, fracture however no saddle anesthesia, motor weakness deficit, bowel bladder incontinence, fevers,  unexplained weight loss or recent trauma. Recommended patient takes Tylenol, ibuprofen, heat/ice pack and does gentle exercises and avoids bed rest. Follow-up in 1 to 2 weeks if back pain is not improved where can consider muscle relaxant and referral to PT if pain has not improved. No imaging indicated at present.  Panic disorder GAD 16. Patient is doing well on Lexapro and propranolol. Would like to continue on these medications. Strongly suggested that patient should find a therapist to talk through life stressors and she was in agreement. Provided patient with additional therapy resources. Follow up with PCP.   02/24/20, MD [facility]

## 2020-03-12 NOTE — Patient Instructions (Signed)
Vickie Gonzales, It was lovely to see you today! I am sorry you are experiencing back pain. It is a probably a muscle spasm and is unlikely to be anything serious. Please continue to take the Tylenol and also take Motrin.  Stay active and try heat/ice packs for the back.  Please come and see [place] in 1 to 2 weeks if the pain has not improved.  For your anxiety please continue the same medication.  We checked your diabetic test today which is much improved from your previous one.   Best wishes and take care,  Dr Allena Katz

## 2020-03-15 DIAGNOSIS — M549 Dorsalgia, unspecified: Secondary | ICD-10-CM | POA: Insufficient documentation

## 2020-03-15 NOTE — Assessment & Plan Note (Signed)
Likely musculoskeletal back pain trigger by sudden movement.  Other important differentials considered were cauda equina, osteomyelitis, fracture however no saddle anesthesia, motor weakness deficit, bowel bladder incontinence, fevers,  unexplained weight loss or recent trauma. Recommended patient takes Tylenol, ibuprofen, heat/ice pack and does gentle exercises and avoids bed rest. Follow-up in 1 to 2 weeks if back pain is not improved where can consider muscle relaxant and referral to PT if pain has not improved. No imaging indicated at present.

## 2020-03-15 NOTE — Assessment & Plan Note (Signed)
GAD 16. Patient is doing well on Lexapro and propranolol. Would like to continue on these medications. Strongly suggested that patient should find a therapist to talk through life stressors and she was in agreement. Provided patient with additional therapy resources. Follow up with PCP.

## 2020-03-21 ENCOUNTER — Other Ambulatory Visit: Payer: Self-pay | Admitting: Family Medicine

## 2020-04-19 ENCOUNTER — Other Ambulatory Visit: Payer: Self-pay | Admitting: Family Medicine

## 2020-04-25 ENCOUNTER — Ambulatory Visit (INDEPENDENT_AMBULATORY_CARE_PROVIDER_SITE_OTHER): Payer: Self-pay | Admitting: Family Medicine

## 2020-04-25 ENCOUNTER — Other Ambulatory Visit: Payer: Self-pay

## 2020-04-25 ENCOUNTER — Encounter: Payer: Self-pay | Admitting: Family Medicine

## 2020-04-25 VITALS — BP 122/80 | HR 105 | Ht 65.71 in | Wt 149.6 lb

## 2020-04-25 DIAGNOSIS — R200 Anesthesia of skin: Secondary | ICD-10-CM

## 2020-04-25 DIAGNOSIS — M542 Cervicalgia: Secondary | ICD-10-CM

## 2020-04-25 DIAGNOSIS — F410 Panic disorder [episodic paroxysmal anxiety] without agoraphobia: Secondary | ICD-10-CM

## 2020-04-25 DIAGNOSIS — G629 Polyneuropathy, unspecified: Secondary | ICD-10-CM

## 2020-04-25 MED ORDER — MELOXICAM 15 MG PO TABS
15.0000 mg | ORAL_TABLET | Freq: Every day | ORAL | 0 refills | Status: DC
Start: 2020-04-25 — End: 2020-05-10

## 2020-04-25 MED ORDER — GABAPENTIN 100 MG PO CAPS
100.0000 mg | ORAL_CAPSULE | Freq: Three times a day (TID) | ORAL | 0 refills | Status: AC
Start: 2020-04-25 — End: ?

## 2020-04-25 NOTE — Patient Instructions (Addendum)
It was great seeing you again today!  I am sorry about your leg numbness.  At your age oftentimes this has to do with a back problem.  I will get x-rays of your back to see if there is any signs of a bone problem, or evidence of a slipped disc.  For your neck problem we will get a thyroid level.  Especially with your history of a thyroid problem and we are pain is at I do think it is a good idea.  We may ultimately need to get an ultrasound of this, but unfortunately I will believe her insurance will cover this and he will have to pay out-of-pocket.  We will avoid that for now if we can.  I will give you call in these results come back and we can figure out what the next.  For your leg numbness we will try a medication called gabapentin.  Also you will find a very low-dose of this medication.  For your neck pain I will start you on something called meloxicam which is very powerful anti-inflammatory medication.

## 2020-04-25 NOTE — Progress Notes (Signed)
Patient given PHQ2 and 9.   Provider aware.  .Rynlee Lisbon R Tanita Palinkas, CMA  

## 2020-04-25 NOTE — Progress Notes (Signed)
CHIEF COMPLAINT / HPI: 34 year old female who presents for left leg numbness and tingling, and right neck/jaw pain.  The patient states that her left leg numbness and tingling is affecting her "entire leg".  Does not seem to follow any particular vascular or neurologic distribution.  Has been going on for a few weeks.  She has not tried anything for it.  She endorses no other neurologic symptoms.  She states that she has had no issues on her right leg.  She has had no fecal or urinary incontinence.  No back pain.  No trauma prior to this starting.  She endorses right anterior neck pain.  She is concerned that she may have a thyroid problem.  She cannot do anything in particular to trigger this pain.  Stating that she woke up with it 1 day.  Going on for approximately 1 week.  Of note she states that she did have a thyroid issue in the past, but I do not see proof of this in her recent epic history.  The patient states that she has significant anxiety, but states that it has improved significantly since I last saw her back in March.  Scored 15 on PHQ-9 states that she has significantly improved and does not want to adjust her medications today.   PERTINENT  PMH / PSH:    OBJECTIVE: BP 122/80   Pulse (!) 105   Ht 5' 5.71" (1.669 m)   Wt 149 lb 9.6 oz (67.9 kg)   SpO2 100%   BMI 24.36 kg/m   Gen: 34 year old female, no acute distress, resting comfortably HEENT: Palpable tenderness to anterior right neck deep to the trachea, no nodular, swollen, or hard thyroid appreciated CV: Mild tachycardia, no M/R/G, regular rhythm Resp: Lungs are auscultation bilaterally, no accessory muscle use Neuro: Alert and oriented, Speech clear, No gross deficits Left leg: No palpable or focal tenderness.  Deep tendon reflexes symmetric as compared to right leg at Achilles and popliteal sites.  5-5 strength to all muscle groups left leg, symmetric compared to right.  No gait abnormality appreciated  ASSESSMENT /  PLAN:  Panic disorder Doing well on Lexapro 10 mg daily, and propranolol 20 mg 3 times daily.  States that she does not want to adjust this today.  She can follow-up with her PCP for this issue at scheduled appointment.  Left leg numbness Does not appear to follow any vascular or nervous distributions.  No objective exam findings to corroborate with her subjective symptoms.  We will get folate, B12 to rule out any vitamin deficiency.  We will get lumbar spine x-rays to see if there is any obvious signs of profound disc herniation or bony abnormality, although I did caution that the x-rays will not pick up soft tissues more than likely.  Given her lack of alarming symptoms and objective findings this is reassuring.  I will try gabapentin 100 mg 3 times daily for now.  I explained this is a very small dose his medication is can be increased if needed, will update plan and patient if lab work or imaging is revealing.  Otherwise can follow-up with PCP for reevaluation in a couple weeks.  Gave ED precautions, gave alarms on warnings.  Neck pain We will get TSH given patient's reported history of thyroid problem and the location of her tenderness.  If negative will consider musculoskeletal etiology is to be the most common in her age group and with her findings.  We will try Mobic 15  mg daily for 2 weeks.  I did recommend neck stretching exercises, ice/heat, and topical liniments along with some Tylenol.  If her pain persists does not seem to improve with the conservative measures as above, I would consider a neck ultrasound, but I do not believe her insurance will cover this so this will probably be expensive for the patient.  Can follow-up as needed with her PCP.  I did discuss alarm symptoms and when to follow-up in clinic versus the ED if her symptoms worsen.     Guadalupe Dawn, MD [place]

## 2020-04-26 DIAGNOSIS — M542 Cervicalgia: Secondary | ICD-10-CM | POA: Insufficient documentation

## 2020-04-26 DIAGNOSIS — R202 Paresthesia of skin: Secondary | ICD-10-CM | POA: Insufficient documentation

## 2020-04-26 DIAGNOSIS — R200 Anesthesia of skin: Secondary | ICD-10-CM | POA: Insufficient documentation

## 2020-04-26 LAB — FOLATE: Folate: >20 ng/mL (ref >3.0)

## 2020-04-26 LAB — VITAMIN B12: Vitamin B-12: 453 pg/mL (ref 232–1245)

## 2020-04-26 LAB — TSH: TSH: 0.864 u[IU]/mL (ref 0.450–4.500)

## 2020-04-26 NOTE — Assessment & Plan Note (Signed)
Doing well on Lexapro 10 mg daily, and propranolol 20 mg 3 times daily.  States that she does not want to adjust this today.  She can follow-up with her PCP for this issue at scheduled appointment.

## 2020-04-26 NOTE — Assessment & Plan Note (Signed)
Does not appear to follow any vascular or nervous distributions.  No objective exam findings to corroborate with her subjective symptoms.  We will get folate, B12 to rule out any vitamin deficiency.  We will get lumbar spine x-rays to see if there is any obvious signs of profound disc herniation or bony abnormality, although I did caution that the x-rays will not pick up soft tissues more than likely.  Given her lack of alarming symptoms and objective findings this is reassuring.  I will try gabapentin 100 mg 3 times daily for now.  I explained this is a very small dose his medication is can be increased if needed, will update plan and patient if lab work or imaging is revealing.  Otherwise can follow-up with PCP for reevaluation in a couple weeks.  Gave ED precautions, gave alarms on warnings.

## 2020-04-26 NOTE — Assessment & Plan Note (Signed)
We will get TSH given patient's reported history of thyroid problem and the location of her tenderness.  If negative will consider musculoskeletal etiology is to be the most common in her age group and with her findings.  We will try Mobic 15 mg daily for 2 weeks.  I did recommend neck stretching exercises, ice/heat, and topical liniments along with some Tylenol.  If her pain persists does not seem to improve with the conservative measures as above, I would consider a neck ultrasound, but I do not believe her insurance will cover this so this will probably be expensive for the patient.  Can follow-up as needed with her PCP.  I did discuss alarm symptoms and when to follow-up in clinic versus the ED if her symptoms worsen.

## 2020-05-10 ENCOUNTER — Encounter (HOSPITAL_COMMUNITY): Payer: Self-pay | Admitting: Emergency Medicine

## 2020-05-10 ENCOUNTER — Emergency Department (HOSPITAL_COMMUNITY)
Admission: EM | Admit: 2020-05-10 | Discharge: 2020-05-10 | Disposition: A | Discharge status: Home / Self Care | Payer: Medicaid Other | Attending: Emergency Medicine | Admitting: Emergency Medicine

## 2020-05-10 ENCOUNTER — Other Ambulatory Visit: Payer: Self-pay

## 2020-05-10 DIAGNOSIS — I10 Essential (primary) hypertension: Secondary | ICD-10-CM | POA: Insufficient documentation

## 2020-05-10 DIAGNOSIS — F17210 Nicotine dependence, cigarettes, uncomplicated: Secondary | ICD-10-CM | POA: Insufficient documentation

## 2020-05-10 DIAGNOSIS — Z79899 Other long term (current) drug therapy: Secondary | ICD-10-CM | POA: Insufficient documentation

## 2020-05-10 DIAGNOSIS — R6884 Jaw pain: Secondary | ICD-10-CM

## 2020-05-10 DIAGNOSIS — Z7984 Long term (current) use of oral hypoglycemic drugs: Secondary | ICD-10-CM | POA: Insufficient documentation

## 2020-05-10 DIAGNOSIS — E119 Type 2 diabetes mellitus without complications: Secondary | ICD-10-CM | POA: Insufficient documentation

## 2020-05-10 MED ORDER — MELOXICAM 15 MG PO TABS
15.0000 mg | ORAL_TABLET | Freq: Every day | ORAL | 0 refills | Status: DC
Start: 2020-05-10 — End: 2020-08-29

## 2020-05-10 MED ORDER — METHOCARBAMOL 500 MG PO TABS
500.0000 mg | ORAL_TABLET | Freq: Two times a day (BID) | ORAL | 0 refills | Status: DC
Start: 2020-05-10 — End: 2020-08-29

## 2020-05-10 NOTE — ED Provider Notes (Signed)
[place] DEPT Provider Note   CSN: [id number] Arrival date & time: 05/10/20  1242     History Chief Complaint  Patient presents with  . Dental Pain    Vickie Gonzales is a 34 y.o. female.  Vickie Gonzales is a 34 y.o. female with history of hypertension, diabetes, GERD, who presents to the ED for evaluation of right-sided jaw and neck pain.  Pain has been present for about 2 weeks now.  She was initially seen by her PCP on 5/27, had lab work done including thyroid studies which were reassuring.  This was felt to likely be musculoskeletal in nature.  She reports that they started her on Mobic which does give her some mild relief, but then pain returns.  She reports pain has continued and intermittently become sharper and more severe.  Pain is worse with chewing or moving her jaw.  She is not sure but thinks she may grind her teeth at night and this may be contributing.  Denies associated headaches, vision changes, facial numbness or tingling.  Pain radiates into her ear sometimes but she has not had any change in hearing or ear drainage.  Has not noticed any focal pain over any 1 tooth, no redness, swelling or drainage from around her gums.  No other aggravating or alleviating factors.  Has not seen a dentist regarding these symptoms.        Past Medical History:  Diagnosis Date  . Diabetes mellitus    On insulin during pregnancy otherwise oral meds  . GERD (gastroesophageal reflux disease)   . Hypertension     Patient Active Problem List   Diagnosis Date Noted  . Left leg numbness 04/26/2020  . Neck pain 04/26/2020  . Back pain 03/15/2020  . Panic disorder 02/15/2020  . Stress 11/03/2018  . Hypertension 03/31/2017  . Healthcare maintenance 10/01/2016  . TOBACCO USER 08/17/2009  . HYPERLIPIDEMIA 07/24/2008  . 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 Diabetes ([place]) 01/27/2007  . OBESITY, NOS 01/27/2007    Past Surgical History:  Procedure  Laterality Date  . CESAREAN SECTION    . CESAREAN SECTION N/A 10/03/2013   Procedure: CESAREAN SECTION;  Surgeon: Emily Filbert, MD;  Location: [facility];  Service: Obstetrics;  Laterality: N/A;     OB History    Gravida  3   Para  2   Term  1   Preterm  1   AB  1   Living  2     SAB      TAB  1   Ectopic      Multiple      Live Births  2           Family History  Problem Relation Age of Onset  . Diabetes Mother   . Cancer Mother        breast cancer    Social History   Tobacco Use  . Smoking status: Current Every Day Smoker    Packs/day: 0.25    Types: Cigarettes  . Smokeless tobacco: Current User  Vaping Use  . Vaping Use: Never used  Substance Use Topics  . Alcohol use: Yes    Comment: occasionally  . Drug use: No    Home Medications Prior to Admission medications   Medication Sig Start Date End Date Taking? Authorizing Provider  escitalopram (LEXAPRO) 10 MG tablet Take 1 tablet by mouth once daily 04/22/20   Lattie Haw, MD  amLODipine (NORVASC) 5 MG  tablet Take 1 tablet (5 mg total) by mouth daily. 02/13/20   Benay Pike, MD  blood glucose meter kit and supplies KIT Dispense based on patient and insurance preference. Use up to four times daily as directed. (FOR ICD-9 250.00, 250.01). 02/11/17   Hedges, Dellis Filbert, PA-C  gabapentin (NEURONTIN) 100 MG capsule Take 1 capsule (100 mg total) by mouth 3 (three) times daily. 04/25/20   Guadalupe Dawn, MD  meloxicam (MOBIC) 15 MG tablet Take 1 tablet (15 mg total) by mouth daily. 05/10/20   Jacqlyn Larsen, PA-C  metFORMIN (GLUCOPHAGE-XR) 500 MG 24 hr tablet Take 1 tablet (500 mg total) by mouth daily with breakfast. Patient not taking: Reported on 05/27/2019 11/02/18   Bufford Lope, DO  methocarbamol (ROBAXIN) 500 MG tablet Take 1 tablet (500 mg total) by mouth 2 (two) times daily. 05/10/20   Jacqlyn Larsen, PA-C  Multiple Vitamin (MULTIVITAMIN WITH MINERALS) TABS tablet Take 1 tablet by mouth daily.     [provider]  propranolol (INDERAL) 20 MG tablet Take 1 tablet (20 mg total) by mouth 3 (three) times daily. 02/20/20   Guadalupe Dawn, MD    Allergies    Patient has no known allergies.  Review of Systems   Review of Systems  Constitutional: Negative for chills and fever.  HENT: Negative for dental problem, facial swelling and mouth sores.   Gastrointestinal: Negative for nausea and vomiting.  Skin: Negative for color change, rash and wound.  Neurological: Negative for headaches.    Physical Exam Updated Vital Signs BP (!) 160/118 (BP Location: Left Arm)   Pulse (!) 105   Temp 98.6 F (37 C) (Oral)   Resp 20   Ht '5\' 5"'  (1.651 m)   Wt 66.7 kg   LMP 05/03/2020 (Exact Date)   SpO2 99%   BMI 24.46 kg/m   Physical Exam Vitals and nursing note reviewed.  Constitutional:      General: She is not in acute distress.    Appearance: Normal appearance. She is well-developed and normal weight. She is not ill-appearing or diaphoretic.  HENT:     Head: Normocephalic and atraumatic.     Right Ear: Tympanic membrane, ear canal and external ear normal.     Mouth/Throat:     Comments: Tenderness to palpation over the right jaw that is worse with movement of the TMJ, no focal tenderness over the any of the teeth, no redness, swelling or noted abscess over the gums.  No sublingual tenderness, posterior oropharynx clear, tenderness extends under the right jaw, no palpable lymphadenopathy and no overlying skin changes. Eyes:     General:        Right eye: No discharge.        Left eye: No discharge.  Pulmonary:     Effort: Pulmonary effort is normal. No respiratory distress.  Musculoskeletal:     Cervical back: Neck supple. No tenderness.  Skin:    General: Skin is warm and dry.     Capillary Refill: Capillary refill takes less than 2 seconds.  Neurological:     Mental Status: She is alert and oriented to person, place, and time.     Coordination: Coordination normal.    Psychiatric:        Mood and Affect: Mood normal.        Behavior: Behavior normal.     ED Results / Procedures / Treatments   Labs (all labs ordered are listed, but only abnormal results are displayed)  Labs Reviewed - No data to display  EKG None  Radiology No results found.  Procedures Procedures (including critical care time)  Medications Ordered in ED Medications - No data to display  ED Course  I have reviewed the triage vital signs and the nursing notes.  Pertinent labs & imaging results that were available during my care of the patient were reviewed by me and considered in my medical decision making (see chart for details).    MDM Rules/Calculators/A&P                         34 year old female presents with continued right-sided jaw and neck pain, was initially seen by her PCP and this was felt to likely be musculoskeletal she did have lab work including thyroid studies ordered which were normal.  She reports that she has been taking Mobic which gives mild relief intermittently but then pain returns.  She reports pain is starting to impact her sleeping.  On exam she has tenderness over the TMJ and right lower jaw, she does not have any tenderness over teeth or evidence of dental abscess, no concern for [place]wick's angina.  No lymphadenopathy.  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  Will prescribe muscle relaxer and I have refilled her Mobic and encouraged her to purchase a night guard to wear to help with teeth grinding.  Encouraged her to continue to follow-up with her PCP as well as a dentist.  Differential also includes trigeminal neuralgia, musculoskeletal neck pain.  She does not have associated headaches or neurologic deficits.  I do not see signs of infection.  At this time there does not appear to be any evidence of an acute emergency medical condition and the patient appears stable  for discharge with appropriate outpatient follow up.Diagnosis was discussed with patient who verbalizes understanding and is agreeable to discharge.  Final Clinical Impression(s) / ED Diagnoses Final diagnoses:  Jaw pain    Rx / DC Orders ED Discharge Orders         Ordered    meloxicam (MOBIC) 15 MG tablet  Daily     Discontinue  Reprint     05/10/20 1342    methocarbamol (ROBAXIN) 500 MG tablet  2 times daily     Discontinue  Reprint     05/10/20 1342           Jacqlyn Larsen, [place] 05/10/20 1410    Wyvonnia Dusky, MD 05/11/20 1036

## 2020-05-10 NOTE — ED Triage Notes (Signed)
Patient arrived by self from home. Patient c/o RT sided mouth pain that began one week ago and is worsening.   Patient was prescribed a muscle relaxant, no relief from medication.   Pain 07/10

## 2020-05-10 NOTE — Discharge Instructions (Signed)
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  Continue to follow-up with your PCP but please also follow-up with a dentist for further evaluation of this pain.

## 2020-08-07 ENCOUNTER — Telehealth: Payer: Self-pay

## 2020-08-07 NOTE — Telephone Encounter (Signed)
Noted and agreed, thank you Page. 

## 2020-08-07 NOTE — Telephone Encounter (Signed)
Patient calls nurse line reporting an episode of light headedness this am. Patient reports she began to feel faint and then "went back to normal" very quickly. Patient did report eating and drinking afterward helped "tremendously." Patient is a diabetic, however does not check her sugars, "I haven't had a meter in a while now." Patient also has a h/o of hypertension, however denies taking any of her medications. Patient denies vision changes, headaches, or SOB. ED precautions given to patient. Patient scheduled with team for next week.

## 2020-08-11 NOTE — Progress Notes (Deleted)
    SUBJECTIVE:   CHIEF COMPLAINT / HPI:   ***  PERTINENT  PMH / PSH: ***  OBJECTIVE:   There were no vitals taken for this visit.  ***  ASSESSMENT/PLAN:   No problem-specific Assessment & Plan notes found for this encounter.     Unknown Schleyer, MD [facility]  

## 2020-08-13 ENCOUNTER — Ambulatory Visit: Payer: Medicaid Other | Admitting: Family Medicine

## 2020-08-29 ENCOUNTER — Ambulatory Visit (INDEPENDENT_AMBULATORY_CARE_PROVIDER_SITE_OTHER): Payer: Self-pay | Admitting: Family Medicine

## 2020-08-29 ENCOUNTER — Encounter: Payer: Self-pay | Admitting: Family Medicine

## 2020-08-29 ENCOUNTER — Other Ambulatory Visit: Payer: Self-pay

## 2020-08-29 VITALS — BP 120/80 | HR 117 | Ht 65.0 in | Wt 135.0 lb

## 2020-08-29 DIAGNOSIS — I10 Essential (primary) hypertension: Secondary | ICD-10-CM

## 2020-08-29 DIAGNOSIS — E119 Type 2 diabetes mellitus without complications: Secondary | ICD-10-CM

## 2020-08-29 DIAGNOSIS — L0291 Cutaneous abscess, unspecified: Secondary | ICD-10-CM | POA: Insufficient documentation

## 2020-08-29 LAB — POCT GLYCOSYLATED HEMOGLOBIN (HGB A1C): HbA1c, POC (controlled diabetic range): 6.3 % (ref 0.0–7.0)

## 2020-08-29 MED ORDER — SULFAMETHOXAZOLE-TRIMETHOPRIM 800-160 MG PO TABS: 1.0000 | ORAL_TABLET | Freq: Two times a day (BID) | ORAL | 0 refills | Status: AC

## 2020-08-29 NOTE — Progress Notes (Signed)
SUBJECTIVE:   CHIEF COMPLAINT / HPI:   Lump the vagina: This is a pleasant 34 year old female who reports that 1 week ago she started noticing a lump to the left of her vaginal area.  She reports that over the last week it has slowly grown bigger and has become extremely painful.  She reports it is so painful she has difficulty lying in bed at night or even sitting in her car to drive anywhere.  She has been soaking her bottom in warm water to try to help the lump burst in order to relieve pressure from it.  So far, this has been unsuccessful.  She has not tried any other medications to help reduce the pain.  Denies any fevers, body aches, chills.  Hypertension: Patient has a history of hypertension for which she is prescribed amlodipine 5 mg daily and propranolol 20 mg 3 times daily.  Today the patient's blood pressure is 120/80 mmHg.  She also reports she has not taken either these medications within the last couple of months.  Denies any chest pain, headaches, shortness of breath.  Diabetes: Patient has a history of diabetes for which she was prescribed Metformin.  Patient also reports she has not taken her Metformin in a couple of months.  Her last A1c was 7.4%.  Denies polyuria, polydipsia, polyphagia, or neuropathy.  PERTINENT  PMH / PSH:  Patient Active Problem List   Diagnosis Date Noted  . Abscess 08/29/2020  . Left leg numbness 04/26/2020  . Neck pain 04/26/2020  . Back pain 03/15/2020  . Panic disorder 02/15/2020  . Stress 11/03/2018  . Hypertension 03/31/2017  . Healthcare maintenance 10/01/2016  . TOBACCO USER 08/17/2009  . HYPERLIPIDEMIA 07/24/2008  . 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 Diabetes ([facility]) 01/27/2007  . OBESITY, NOS 01/27/2007     OBJECTIVE:   BP 120/80   Pulse (!) 117   Ht 5\' 5"  (1.651 m)   Wt 135 lb (61.2 kg)   LMP 08/22/2020   SpO2 100%   BMI 22.47 kg/m    Physical exam: General: Well-appearing patient, very pleasant Respiratory: CTA  bilaterally, comfortable work of breathing GU: 1.5 cm x 1 cm fluctuant, erythematous mass appreciated to patient's left labia majora with centralized area of hardened purulent material without surrounding cellulitis.  Diagnosis: abscess - Location: Left Labia majora Procedure: Incision & drainage Informed consent:  Discussed risks (infection, pain, bleeding, bruising, numbness, and recurrence of the condition) and benefits of the procedure, as well as the alternatives.  Informed consent was obtained. Anesthesia: 2cc 1% Lidocaine with Epinephrine The area was prepared and draped in a standard fashion. The lesion drained pus. A large amount of fluid was drained. The patient tolerated the procedure well. The patient was instructed on post-op care.   ASSESSMENT/PLAN:   Hypertension Patient previously prescribed propranolol 20 mg 3 times daily and amlodipine 5 mg once daily.  Patient reports she is no longer taking either these medications.  Blood pressure today is normal at 120/80. -Advised patient to stop taking medications.  Can continue monitoring her blood pressure at home.  Diabetes (HCC) Patient's most recent HbA1c 7.4%.  Patient was previously prescribed Metformin 500 mg daily, however reports that for the last month or 2 she has no longer taking this medication.  Denies any polyuria, polydipsia, polyphagia, or neuropathy. -Rechecking patient's HbA1c at today's visit so we can advise her whether or not she should continue her Metformin.  Abscess Appreciated to patient's left labia majora.  Incision and drainage performed at today's visit producing copious amounts of purulent material.  Patient reported immediate relief of her pain after the I&D. -Patient given strict return precautions regarding follow-up for the abscess -Placed on Bactrim for infection prevention -Wound culture performed at today's visit, we will follow-up with results and alter treatment course as necessary      Dollene Cleveland, DO [facility]

## 2020-08-29 NOTE — Assessment & Plan Note (Signed)
Patient's most recent HbA1c 7.4%.  Patient was previously prescribed Metformin 500 mg daily, however reports that for the last month or 2 she has no longer taking this medication.  Denies any polyuria, polydipsia, polyphagia, or neuropathy. -Rechecking patient's HbA1c at today's visit so we can advise her whether or not she should continue her Metformin.

## 2020-08-29 NOTE — Assessment & Plan Note (Signed)
Appreciated to patient's left labia majora.  Incision and drainage performed at today's visit producing copious amounts of purulent material.  Patient reported immediate relief of her pain after the I&D. -Patient given strict return precautions regarding follow-up for the abscess -Placed on Bactrim for infection prevention -Wound culture performed at today's visit, we will follow-up with results and alter treatment course as necessary

## 2020-08-29 NOTE — Patient Instructions (Addendum)
Thank you for coming in to see [place] today! Please see below to review our plan for today's visit:  1. I encourage you to do a Sitz bath at least once daily.  2. Take Bactrim 1 tablet twice daily for 5 days. 3. You can stop meds for your High blood pressure - your BP today was perfect at 120/10mmHg.   Please call the clinic at [phone] if your symptoms worsen or you have any concerns. It was our pleasure to serve you!   Dr. Peggyann Shoals [facility]

## 2020-08-29 NOTE — Assessment & Plan Note (Signed)
Patient previously prescribed propranolol 20 mg 3 times daily and amlodipine 5 mg once daily.  Patient reports she is no longer taking either these medications.  Blood pressure today is normal at 120/80. -Advised patient to stop taking medications.  Can continue monitoring her blood pressure at home.

## 2020-09-03 LAB — WOUND CULTURE

## 2020-10-23 ENCOUNTER — Ambulatory Visit (INDEPENDENT_AMBULATORY_CARE_PROVIDER_SITE_OTHER): Payer: Medicaid Other | Admitting: Family Medicine

## 2020-10-23 DIAGNOSIS — Z5329 Procedure and treatment not carried out because of patient's decision for other reasons: Secondary | ICD-10-CM

## 2020-10-23 DIAGNOSIS — F419 Anxiety disorder, unspecified: Secondary | ICD-10-CM

## 2020-10-23 NOTE — Progress Notes (Signed)
Patient was a no-show to 11/24 appointment for follow-up for anxiety.  Anxiety: Of note V patient was last seen in May 2021 for follow-up of panic disorder, physician at the time remarks patient was doing well on Lexapro 10 and propranolol 20 mg 3 times daily.  In September 2021 office visit patient remarks that she was no longer taking these medications.  Patient needs a GAD-7 screening.  Health maintenance: Patient has a history of diabetes, she is overdue for urine microalbumin, foot exam, ophthalmology exam, pneumonia vaccine.  Additionally patient is overdue for COVID-19 vaccine, Pap smear, and flu shot.   Dollene Cleveland, DO [place] [facility]

## 2020-10-29 DIAGNOSIS — Z5329 Procedure and treatment not carried out because of patient's decision for other reasons: Secondary | ICD-10-CM | POA: Insufficient documentation

## 2020-10-29 DIAGNOSIS — Z91199 Patient's noncompliance with other medical treatment and regimen due to unspecified reason: Secondary | ICD-10-CM | POA: Insufficient documentation

## 2020-10-30 ENCOUNTER — Ambulatory Visit (INDEPENDENT_AMBULATORY_CARE_PROVIDER_SITE_OTHER): Payer: Medicaid Other | Admitting: Family Medicine

## 2020-10-30 DIAGNOSIS — Z5329 Procedure and treatment not carried out because of patient's decision for other reasons: Secondary | ICD-10-CM

## 2020-10-30 DIAGNOSIS — F410 Panic disorder [episodic paroxysmal anxiety] without agoraphobia: Secondary | ICD-10-CM

## 2020-10-30 NOTE — Progress Notes (Signed)
Patient late-cancelled again to appt. She has missed 4 appts now on 08/13/2020, 10/23/2020, 08/30/2020, and 11/01/2020. I called her at the number in the chart [phone], there was no answer, no opportunity to leave a voicemail as the voicemail box had not yet been set up yet.   Patient was sent a letter in the mail and informed that one more missed appt would lead to dismissal from our clinic.  Anxiety: patient was last seen in May 2021 for follow-up of panic disorder, physician at the time remarks patient was doing well on Lexapro 10 and propranolol 20 mg 3 times daily.  In September 2021 office visit patient remarks that she was no longer taking these medications.  Patient needs a GAD-7 screening.   Health maintenance: Patient has a history of diabetes, she is overdue for urine microalbumin, foot exam, ophthalmology exam, pneumonia vaccine. Additionally patient is overdue for COVID-19 vaccine, Pap smear, and flu shot.    Dollene Cleveland, DO [facility]

## 2020-11-01 ENCOUNTER — Ambulatory Visit: Payer: Medicaid Other | Admitting: Family Medicine

## 2020-11-04 ENCOUNTER — Encounter: Payer: Self-pay | Admitting: Family Medicine

## 2020-11-04 ENCOUNTER — Ambulatory Visit (INDEPENDENT_AMBULATORY_CARE_PROVIDER_SITE_OTHER): Payer: Self-pay | Admitting: Family Medicine

## 2020-11-04 ENCOUNTER — Other Ambulatory Visit: Payer: Self-pay

## 2020-11-04 ENCOUNTER — Ambulatory Visit (HOSPITAL_COMMUNITY)
Admission: RE | Admit: 2020-11-04 | Discharge: 2020-11-04 | Disposition: A | Discharge status: Home / Self Care | Payer: Medicaid Other | Source: Ambulatory Visit | Attending: Family Medicine | Admitting: Family Medicine

## 2020-11-04 VITALS — BP 130/80 | HR 120 | Ht 65.0 in | Wt 134.0 lb

## 2020-11-04 DIAGNOSIS — F419 Anxiety disorder, unspecified: Secondary | ICD-10-CM | POA: Insufficient documentation

## 2020-11-04 DIAGNOSIS — R079 Chest pain, unspecified: Secondary | ICD-10-CM

## 2020-11-04 MED ORDER — ESCITALOPRAM OXALATE 5 MG PO TABS: 10.0000 mg | ORAL_TABLET | Freq: Every day | ORAL | 0 refills | Status: DC

## 2020-11-04 NOTE — Progress Notes (Signed)
    SUBJECTIVE:   CHIEF COMPLAINT / HPI: anxiety  Patient reports history of anxiety.  She reports that she is having difficulty sleeping at night only sleeping 3 hours.  Her appetite has decreased.  She is also exhibiting some symptoms of depression.  She feels as though work is contributing to her increase in anxiety.  She has had counseling in the past.  She was on medications but she is unsure what medications she was taking in the past.  Currently denies any suicidal ideation or homicidal ideation.  Was never hospitalized for previous suicidal attempts and has no active plan for self-harm.  Chest pain Patient reports intermittent chest pain recently.  She is not currently having any chest pain.  She reports that it happens when she feels overwhelmed.  Pain does not radiate to jaw or arm.  Denies any nausea or vomiting and no diaphoresis.  PERTINENT  PMH / PSH:  Anxiety Depression  OBJECTIVE:   BP 130/80   Pulse (!) 120   Ht 5\' 5"  (1.651 m)   Wt 134 lb (60.8 kg)   LMP 10/09/2020 (Within Days)   SpO2 98%   BMI 22.30 kg/m    General: Alert and oriented, no apparent distress  Neck: nontender, no thyromegaly Cardiovascular: RRR with no murmurs noted Respiratory: CTA bilaterally  Psych: Behavior and speech appropriate to situation, no SI or HI.  ASSESSMENT/PLAN:   Anxiety PHQ-9 and GAD reviewed and positive for depression and anxiety.  Noted tachycardia on exam heart rate 120.  Of note she reports having some atypical chest pain when feeling overwhelmed but is currently not having any chest pain today.  She is otherwise asymptomatic today.  No suicidal ideation. -EKG shows sinus tach  -BMP today to rule out electrolyte abn -Likely tachycardia related to anxiety, if continues to remain tachycardic can consider further work-up. -We will start Lexapro 10 mg daily -CCM referral for mental health resources, patient agreeable -Mental health resources also provided in AVS as well as  24-hour crisis line -Follow-up appointment made with me December 17     December 19, MD [facility]

## 2020-11-04 NOTE — Patient Instructions (Addendum)
Thank you for coming to see me today. It was a pleasure.   I have sent a referral to the social worker to help with with finding resources for therapy.  You can also search for therapists on ItCheaper.dk.  Please follow-up with me in 3 weeks  If you have any questions or concerns, please do not hesitate to call the office at [phone].  Best,   Dana Allan, MD Family Medicine Residency   Outpatient Mental Health Providers (No Insurance required or Self Pay)  [facility]  [address] [place], [place] [phone] Crisis [phone]  [facility]) can see uninsured folks for outpatient therapy [email or web address] [address] [place], [place] [address] [phone]  [facility]    Walk-in Mon-Fri, 8am-3pm [email or web address] Scull [address], [place], [place]  [phone]   [address]  [place] [phone] [facility] for psych med management, there may be a wait- if [facility] is working with clients for OPT, they will coordinate with [facility] for psych  [facility]   Walk-in-Clinic: Monday- Friday 9:00 AM - 4:00 PM [address]   [place], [place] [phone]  [facility] [place] ([employer]) walk in M-F 8am-12pm and  1pm-3pm [facility]- [address]     [phone]  [employer] -[address]  Phone: [phone]  [employer] (Mental Health and substance challenges) [address]   [place] [phone]    [email or web address]    [facility]  [place] -[address], [place]     Phone:  [phone] [place]-  [address]  [phone]   [facility]  [address] [place]  [phone] [email or web address]   [facility] ( virtual or zoom therapy) [email or web address]  [address] [place]  [phone]    [facility] [phone]  grief counseling, dementia and caregiver support    [facility] Walk-in MWF 12:30 to 3:00     [address] [place] [address]  [phone]  www.[email or web address] call to schedule an appointment    [facility] Classes ,Support group, Peer support services, [address], [place], [place] [address] [phone]  PhotoSolver.pl           National Alliance on Mental Illness (NAMI) [place]- Wellness classes, Support groups        [address] [place], [place] [address] [phone]   [email or web address]   [facility]  (Psycho-social Rehabilitation clubhouse, Individual and group therapy) [address] [place], [place] [address]   [phone]  24- Hour Availability:  [facility] [phone] or [phone] * [facility] [employer] (Domestic Violence, Rape, etc. )[phone] Vesta Mixer [phone] or [phone] * [facility] [phone] only) [phone] (after hours) *[facility] Mobile Crisis Unit [phone] *[place] National Suicide Hotline 4844393029 Len Childs)

## 2020-11-05 ENCOUNTER — Telehealth: Payer: Self-pay | Admitting: *Deleted

## 2020-11-05 LAB — BASIC METABOLIC PANEL
BUN/Creatinine Ratio: 13 (ref 9–23)
BUN: 10 mg/dL (ref 6–20)
CO2: 22 mmol/L (ref 20–29)
Calcium: 9.4 mg/dL (ref 8.7–10.2)
Chloride: 103 mmol/L (ref 96–106)
Creatinine, Ser: 0.79 mg/dL (ref 0.57–1.00)
GFR calc Af Amer: 115 mL/min/{1.73_m2} (ref >59)
GFR calc non Af Amer: 99 mL/min/{1.73_m2} (ref >59)
Glucose: 131 mg/dL — ABNORMAL HIGH (ref 65–99)
Potassium: 4.1 mmol/L (ref 3.5–5.2)
Sodium: 140 mmol/L (ref 134–144)

## 2020-11-05 LAB — CBC
Hematocrit: 42.3 % (ref 34.0–46.6)
Hemoglobin: 14.5 g/dL (ref 11.1–15.9)
MCH: 33.1 pg — ABNORMAL HIGH (ref 26.6–33.0)
MCHC: 34.3 g/dL (ref 31.5–35.7)
MCV: 97 fL (ref 79–97)
Platelets: 339 10*3/uL (ref 150–450)
RBC: 4.38 x10E6/uL (ref 3.77–5.28)
RDW: 12.2 % (ref 11.7–15.4)
WBC: 10.8 10*3/uL (ref 3.4–10.8)

## 2020-11-05 NOTE — Chronic Care Management (AMB) (Signed)
  Care Management   Note  11/05/2020 Name: Vickie Gonzales MRN: [id number] DOB: [birth year]  Vickie Gonzales is a 34 y.o. year old female who is a primary care patient of Towanda Octave, MD. I reached out to Vickie Gonzales by phone today in response to a referral sent by Vickie Gonzales PCP Towanda Octave, MD.   Vickie Gonzales was given information about care management services today including:  1. Care management services include personalized support from designated clinical staff supervised by her physician, including individualized plan of care and coordination with other care providers 2. 24/7 contact phone numbers for assistance for urgent and routine care needs. 3. The patient may stop care management services at any time by phone call to the office staff.  Patient agreed to services and verbal consent obtained.   Follow up plan: Telephone appointment with care management team member scheduled for:  [facility] Guide, Embedded Care Coordination [facility] Management  Direct Dial [phone]

## 2020-11-08 ENCOUNTER — Encounter (HOSPITAL_COMMUNITY): Payer: Self-pay

## 2020-11-08 ENCOUNTER — Other Ambulatory Visit: Payer: Self-pay

## 2020-11-08 ENCOUNTER — Ambulatory Visit: Payer: Medicaid Other | Admitting: Licensed Clinical Social Worker

## 2020-11-08 ENCOUNTER — Emergency Department (HOSPITAL_COMMUNITY)
Admission: EM | Admit: 2020-11-08 | Discharge: 2020-11-09 | Disposition: A | Discharge status: Home / Self Care | Payer: Self-pay | Attending: Emergency Medicine | Admitting: Emergency Medicine

## 2020-11-08 ENCOUNTER — Emergency Department (HOSPITAL_COMMUNITY): Payer: Self-pay

## 2020-11-08 DIAGNOSIS — F17210 Nicotine dependence, cigarettes, uncomplicated: Secondary | ICD-10-CM | POA: Insufficient documentation

## 2020-11-08 DIAGNOSIS — F410 Panic disorder [episodic paroxysmal anxiety] without agoraphobia: Secondary | ICD-10-CM

## 2020-11-08 DIAGNOSIS — R0602 Shortness of breath: Secondary | ICD-10-CM | POA: Insufficient documentation

## 2020-11-08 DIAGNOSIS — I10 Essential (primary) hypertension: Secondary | ICD-10-CM | POA: Insufficient documentation

## 2020-11-08 DIAGNOSIS — F439 Reaction to severe stress, unspecified: Secondary | ICD-10-CM

## 2020-11-08 DIAGNOSIS — Z7984 Long term (current) use of oral hypoglycemic drugs: Secondary | ICD-10-CM | POA: Insufficient documentation

## 2020-11-08 DIAGNOSIS — F419 Anxiety disorder, unspecified: Secondary | ICD-10-CM

## 2020-11-08 DIAGNOSIS — E119 Type 2 diabetes mellitus without complications: Secondary | ICD-10-CM | POA: Insufficient documentation

## 2020-11-08 NOTE — ED Provider Notes (Signed)
[facility] DEPT Provider Note: Georgena Spurling, MD, FACEP  CSN: [id number] MRN: [id number] ARRIVAL: 11/08/20 at [place]: WA03/WA03   CHIEF COMPLAINT  Shortness of Breath   HISTORY OF PRESENT ILLNESS  11/08/20 11:57 PM Vickie Gonzales is a 34 y.o. female who started Lexapro 3 days ago for anxiety.  Since that time she has developed shortness of breath with exertion.  It has been associated with wheezing.  She has no history of asthma or bronchitis.  She continues to feel anxious which makes her tremulous.  She has taken Lexapro in the past without feeling short of breath.  She has not had cough, fever or other symptoms to suggest Covid and she is tested every Thursday and has been negative.  Symptoms are moderate.   Past Medical History:  Diagnosis Date  . Diabetes mellitus    On insulin during pregnancy otherwise oral meds  . GERD (gastroesophageal reflux disease)   . Hypertension     Past Surgical History:  Procedure Laterality Date  . CESAREAN SECTION    . CESAREAN SECTION N/A 10/03/2013   Procedure: CESAREAN SECTION;  Surgeon: Emily Filbert, MD;  Location: [facility];  Service: Obstetrics;  Laterality: N/A;    Family History  Problem Relation Age of Onset  . Diabetes Mother   . Cancer Mother        breast cancer    Social History   Tobacco Use  . Smoking status: Current Every Day Smoker    Packs/day: 0.25    Types: Cigarettes  . Smokeless tobacco: Current User  Vaping Use  . Vaping Use: Never used  Substance Use Topics  . Alcohol use: Yes    Comment: occasionally  . Drug use: No    Prior to Admission medications   Medication Sig Start Date End Date Taking? Authorizing Provider  blood glucose meter kit and supplies KIT Dispense based on patient and insurance preference. Use up to four times daily as directed. (FOR ICD-9 250.00, 250.01). 02/11/17   Hedges, Dellis Filbert, PA-C  escitalopram (LEXAPRO) 5 MG tablet Take 2 tablets (10 mg total) by mouth daily. 11/04/20    Carollee Leitz, MD  gabapentin (NEURONTIN) 100 MG capsule Take 1 capsule (100 mg total) by mouth 3 (three) times daily. 04/25/20   Guadalupe Dawn, MD  Multiple Vitamin (MULTIVITAMIN WITH MINERALS) TABS tablet Take 1 tablet by mouth daily.    [provider]  metFORMIN (GLUCOPHAGE-XR) 500 MG 24 hr tablet Take 1 tablet (500 mg total) by mouth daily with breakfast. Patient not taking: Reported on 05/27/2019 11/02/18 11/09/20  Bufford Lope, DO    Allergies Patient has no known allergies.   REVIEW OF SYSTEMS  Negative except as noted here or in the History of Present Illness.   PHYSICAL EXAMINATION  Initial Vital Signs Blood pressure (!) 140/115, pulse (!) 118, temperature 98.4 F (36.9 C), temperature source Oral, resp. rate 20, height _0  (1.651 m), weight 60.8 kg, last menstrual period 10/09/2020, SpO2 98 %.  Examination General: Well-developed, well-nourished female in no acute distress; appearance consistent with age of record HENT: normocephalic; atraumatic Eyes: pupils equal, round and reactive to light; extraocular muscles intact Neck: supple Heart: regular rate and rhythm Lungs: clear to auscultation bilaterally Abdomen: soft; nondistended; nontender; bowel sounds present Extremities: No deformity; full range of motion; pulses normal Neurologic: Awake, alert and oriented; motor function intact in all extremities and symmetric; no facial droop Skin: Warm and dry Psychiatric: Normal mood and affect  RESULTS  Summary of this visit's results, reviewed and interpreted by myself:   EKG Interpretation  Date/Time:  Friday November 08 2020 23:43:23 EST Ventricular Rate:  120 PR Interval:    QRS Duration: 79 QT Interval:  318 QTC Calculation: 450 R Axis:   66 Text Interpretation: Sinus tachycardia Probable left atrial enlargement Minimal ST depression, diffuse leads Rate is faster Confirmed by Shanon Rosser [phone]) on 11/08/2020 11:55:22 PM      Laboratory  Studies: No results found for this or any previous visit (from the past 24 hour(s)). Imaging Studies: DG Chest 2 View  Result Date: 11/08/2020 CLINICAL DATA:  Shortness of breath EXAM: CHEST - 2 VIEW COMPARISON:  02/18/2020 FINDINGS: The heart size and mediastinal contours are within normal limits. Both lungs are clear. The visualized skeletal structures are unremarkable. IMPRESSION: No active cardiopulmonary disease. Electronically Signed   By: Donavan Foil M.D.   On: 11/08/2020 23:25    ED COURSE and MDM  Nursing notes, initial and subsequent vitals signs, including pulse oximetry, reviewed and interpreted by myself.  Vitals:   11/08/20 2304 11/08/20 2305 11/09/20 0009 11/09/20 0015  BP: (!) 140/115  (!) 137/106 (!) 136/104  Pulse: (!) 118  (!) 112 100  Resp: _0 Temp: 98.4 F (36.9 C)     TempSrc: Oral     SpO2: 98%  99% 98%  Weight:  60.8 kg    Height:  _1  (1.651 m)     Medications  albuterol (VENTOLIN HFA) 108 (90 Base) MCG/ACT inhaler 2 puff (2 puffs Inhalation Given 11/09/20 0020)  aerochamber plus with mask device 1 each (1 each Other Given 11/09/20 0020)   12:51 AM Patient feels better after albuterol inhaler treatment. Patient given inhaler and instructed in its use. I suspect the patient has been having bronchospasm but she was advised to consult her prescriber if symptoms persist as she may be having her bad reaction to the Lexapro.   PROCEDURES  Procedures   ED DIAGNOSES     ICD-10-CM   1. Shortness of breath  R06.02        Shanon Rosser, MD 11/09/20 [phone]

## 2020-11-08 NOTE — Patient Instructions (Signed)
  Ms. Lopata  it was nice speaking with you. Please call me directly [phone] if you have questions about the goals we discussed. Goals Addressed            This Visit's Progress   . Begin and Stick with Counseling-Depression       Timeframe:  Short-Term Goal Priority:  High Start Date:    11/08/2020                         Expected End Date:  12/30/2020                   Follow Up Date 11/05/2020  Patient Goals/Self-Care Activities: Over the next 14 days . implement interventions discussed today to decreases symptoms of anxiety, depression, and stress and increase knowledge and/or ability of: coping skills, self-management skills, and stress reduction. . Review EMMI educational information (Breathing to relax) . I have placed the counseling referral to [facility]      . Find Help in My Community       Timeframe:  Short-Term Goal Priority:  High Start Date:   11/08/2020                         Expected End Date:  12/30/2020                    Follow Up Date 11/15/2021    Patient Goals/Self-Care Activities: Over the next 14 days . Call DSS to inquire about [employer] program for rental assistance  . Apply for Medicaid       Ms. Schachter received Care Management services today:  1. Care Management services include personalized support from designated clinical staff supervised by her physician, including individualized plan of care and coordination with other care providers 2. 24/7 contact [phone] for assistance for urgent and routine care needs. 3. Care Management are voluntary services and be declined at any time by calling the office.  Patient verbalizes understanding of instructions provided today.    Follow up plan: SW will follow up with patient by phone over the next 7 to 10 days  Soundra Pilon, LCSW

## 2020-11-08 NOTE — ED Provider Notes (Incomplete)
[facility] DEPT Provider Note: Georgena Spurling, MD, FACEP  CSN: [id number] MRN: [id number] ARRIVAL: 11/08/20 at [place]: WA03/WA03   CHIEF COMPLAINT  Shortness of Breath   HISTORY OF PRESENT ILLNESS  11/08/20 11:57 PM Vickie Gonzales is a 34 y.o. female who started Lexapro 3 days ago for anxiety.    Past Medical History:  Diagnosis Date  . Diabetes mellitus    On insulin during pregnancy otherwise oral meds  . GERD (gastroesophageal reflux disease)   . Hypertension     Past Surgical History:  Procedure Laterality Date  . CESAREAN SECTION    . CESAREAN SECTION N/A 10/03/2013   Procedure: CESAREAN SECTION;  Surgeon: Emily Filbert, MD;  Location: [facility];  Service: Obstetrics;  Laterality: N/A;    Family History  Problem Relation Age of Onset  . Diabetes Mother   . Cancer Mother        breast cancer    Social History   Tobacco Use  . Smoking status: Current Every Day Smoker    Packs/day: 0.25    Types: Cigarettes  . Smokeless tobacco: Current User  Vaping Use  . Vaping Use: Never used  Substance Use Topics  . Alcohol use: Yes    Comment: occasionally  . Drug use: No    Prior to Admission medications   Medication Sig Start Date End Date Taking? Authorizing Provider  blood glucose meter kit and supplies KIT Dispense based on patient and insurance preference. Use up to four times daily as directed. (FOR ICD-9 250.00, 250.01). 02/11/17   Hedges, Dellis Filbert, PA-C  escitalopram (LEXAPRO) 5 MG tablet Take 2 tablets (10 mg total) by mouth daily. 11/04/20   Carollee Leitz, MD  gabapentin (NEURONTIN) 100 MG capsule Take 1 capsule (100 mg total) by mouth 3 (three) times daily. 04/25/20   Guadalupe Dawn, MD  metFORMIN (GLUCOPHAGE-XR) 500 MG 24 hr tablet Take 1 tablet (500 mg total) by mouth daily with breakfast. Patient not taking: Reported on 05/27/2019 11/02/18   Bufford Lope, DO  Multiple Vitamin (MULTIVITAMIN WITH MINERALS) TABS tablet Take 1 tablet by mouth daily.     [provider]    Allergies Patient has no known allergies.   REVIEW OF SYSTEMS  Negative except as noted here or in the History of Present Illness.   PHYSICAL EXAMINATION  Initial Vital Signs Blood pressure (!) 140/115, pulse (!) 118, temperature 98.4 F (36.9 C), temperature source Oral, resp. rate 20, height _0  (1.651 m), weight 60.8 kg, last menstrual period 10/09/2020, SpO2 98 %.  Examination General: Well-developed, well-nourished female in no acute distress; appearance consistent with age of record HENT: normocephalic; atraumatic Eyes: pupils equal, round and reactive to light; extraocular muscles intact Neck: supple Heart: regular rate and rhythm; no murmurs, rubs or gallops Lungs: clear to auscultation bilaterally Abdomen: soft; nondistended; nontender; no masses or hepatosplenomegaly; bowel sounds present Extremities: No deformity; full range of motion; pulses normal Neurologic: Awake, alert and oriented; motor function intact in all extremities and symmetric; no facial droop Skin: Warm and dry Psychiatric: Normal mood and affect   RESULTS  Summary of this visit's results, reviewed and interpreted by myself:   EKG Interpretation  Date/Time:  Friday November 08 2020 23:43:23 EST Ventricular Rate:  120 PR Interval:    QRS Duration: 79 QT Interval:  318 QTC Calculation: 450 R Axis:   66 Text Interpretation: Sinus tachycardia Probable left atrial enlargement Minimal ST depression, diffuse leads Rate is faster Confirmed by  Satrina Magallanes, Jenny Reichmann [phone]) on 11/08/2020 11:55:22 PM      Laboratory Studies: No results found for this or any previous visit (from the past 24 hour(s)). Imaging Studies: DG Chest 2 View  Result Date: 11/08/2020 CLINICAL DATA:  Shortness of breath EXAM: CHEST - 2 VIEW COMPARISON:  02/18/2020 FINDINGS: The heart size and mediastinal contours are within normal limits. Both lungs are clear. The visualized skeletal structures are  unremarkable. IMPRESSION: No active cardiopulmonary disease. Electronically Signed   By: Donavan Foil M.D.   On: 11/08/2020 23:25    ED COURSE and MDM  Nursing notes, initial and subsequent vitals signs, including pulse oximetry, reviewed and interpreted by myself.  Vitals:   11/08/20 2301 11/08/20 2304 11/08/20 2305  BP:  (!) 140/115   Pulse:  (!) 118   Resp:  20   Temp:  98.4 F (36.9 C)   TempSrc:  Oral   SpO2: 98% 98%   Weight:   60.8 kg  Height:   _0  (1.651 m)   Medications - No data to display    PROCEDURES  Procedures   ED DIAGNOSES  No diagnosis found.

## 2020-11-08 NOTE — Chronic Care Management (AMB) (Signed)
Care Management  Initial Clinical Social Work General Note  11/08/2020 Name: Vickie Gonzales MRN: [id number] DOB: [birth year]  Vickie Gonzales is enrolled in a Managed Medicaid plan: No. Outreach attempt today was successful.   Vickie Gonzales is a 34 y.o. year old female who is a primary care patient of Lattie Haw, MD. The Care Management team was consulted to assist the patient with [employer]  and [place] and Resources. See care plan below for details.  SDOH (Social Determinants of Health) screening performed today:  Yes. SDOH Interventions   Flowsheet Row Most Recent Value  SDOH Interventions   SDOH Interventions for the Following Domains Depression  Housing Interventions Other (Comment)  Depression Interventions/Treatment  Referral to Psychiatry, Counseling    Advanced Directives Status:Not addressed in this encounter.       Patient Care Plan: Social Work / LCSW  Problem Identified: Emotional Distress   Goal: Over the next 60 days, patient will work with SW to reduce or manage symptoms of anxiety, depression, and stress until connected for ongoing counseling.   Start Date: 11/08/2020  Expected End Date: 12/30/2020  This Visit's Progress: On track  Priority: High  Objective: GAD score of 14 is an indication of  moderate to Severe symptoms of anxiety.  PHQ-9 score of 13 is an indication of moderate symptoms of depression. Depression screen [facility] 2/9 11/04/2020 08/29/2020 04/25/2020  Decreased Interest 2 0 1  Down, Depressed, Hopeless _0 PHQ - 2 Score _1 Altered sleeping _2 Tired, decreased energy _3 Change in appetite _4 Feeling bad or failure about yourself  2 0 2  Trouble concentrating 2 0 2  Moving slowly or fidgety/restless 0 0 2  Suicidal thoughts 0 0 0  PHQ-9 Score _5 Difficult doing work/chores - - -    GAD 7 : Generalized Anxiety Score 11/04/2020 02/20/2020 02/13/2020 02/13/2020  Nervous, Anxious, on Edge _6 Control/stop worrying _7 Worry too much - different things _8 Trouble relaxing _9 Restless _10 Easily annoyed or irritable _11 Afraid - awful might happen _12 Total GAD 7 Score _13 Anxiety Difficulty - - Very difficult -   Assessment, progress and current barriers: Patient is currently experiencing symptoms of depression which seems to be exacerbated by social stressors of being out of work, behind in bills and physical health concerns.  history of counseling about 6 years ago. Just restarted Lexapro 72m 3 days ago. Reports no missed doses. .Leodis [place]knowledge of where and how to connect for ongoing counseling due to not having insurance, needs Support, Education, and Care Coordination in order to meet unmet need.  Clinical Interventions:  . Assessed patient's previous treatment, needs, coping skills, current treatment, support system and barriers to care . Provided basic mental health support, education and interventions ( EMMI education :Breathing to relax) . Discussed several options for long term counseling based on need and insurance. Assisted patient with narrowing the options down to ([facility] ) . Reviewed mental health medications with patient prescribed by PCP and discussed compliance  . [facility]  . Other interventions include: Solution-Focused Strategies and Psychoeducation and/or Health Education  Patient Goals/Self-Care Activities:  Over the next 14 days . implement interventions discussed today to decreases symptoms of anxiety, depression, and stress and increase knowledge and/or ability of: coping skills, self-management skills, and stress reduction. . Review EMMI educational information (Breathing to relax) . I have placed the counseling referral to [facility]  Follow Up Plan:  Appointment scheduled for SW follow up with client by  phone on:  12.17/2021   Problem Identified: Barriers to Treatment   Goal: Over the next 30 days, patient will  explore options discussed to resolve barriers to care for rental assistance and health insurance   Start Date: 11/08/2020  Expected End Date: 12/30/2020  This Visit's Progress: On track  Priority: High  Assessment, progress & current barriers:  Patient lost job/ covid related and needs assistance with rent.  She has contacted the [place] and waiting to hear from them. Also does not have health insurance due to job loss. Acknowledges deficits with meeting this unmet need Interventions:  . Assessment of needs, barriers , agencies contacted, as well as how impacting  . Review various resources and discussed options   . Provided patient with information about [employer] program for rental assistance and  . DSS to apply for Medicaid she has minor children in the home and has no income . Other interventions provided:Solution-Focused Strategies, Emotional/Supportive Counseling, Psychoeducation and/or Health Education, and Problem Solving Patient Goals/Self-Care Activities: Over the next 14 days . Call DSS to inquire about [employer] program for rental assistance  . Apply for Medicaid Follow Up Plan: SW will follow up with patient by phone over the next 7 to 10 days     Outpatient Encounter Medications as of 11/08/2020  Medication Sig  . blood glucose meter kit and supplies KIT Dispense based on patient and insurance preference. Use up to four times daily as directed. (FOR ICD-9 250.00, 250.01).  Marland Kitchen escitalopram (LEXAPRO) 5 MG tablet Take 2 tablets (10 mg total) by mouth daily.  Marland Kitchen gabapentin (NEURONTIN) 100 MG capsule Take 1 capsule (100 mg total) by mouth 3 (three) times daily.  . metFORMIN (GLUCOPHAGE-XR) 500 MG 24 hr tablet Take 1 tablet (500 mg total) by mouth daily with breakfast. (Patient not taking: Reported on 05/27/2019)  . Multiple Vitamin (MULTIVITAMIN WITH  MINERALS) TABS tablet Take 1 tablet by mouth daily.   No facility-administered encounter medications on file as of 11/08/2020.   Ms. Brossard was given information about Care Management services today including:  1. Care Management services include personalized support from designated clinical staff supervised by her physician, including individualized plan of care and coordination with other care providers 2. 24/7 contact phone numbers for assistance for urgent and routine care needs. 3. The patient may stop care management services at any time (effective at the end of the month) by phone call to the office staff.  Patient agreed to services and verbal consent obtained.   Maurine Cane, LCSW

## 2020-11-08 NOTE — ED Triage Notes (Signed)
Pt arrives EMS with c/o shob and feeling weak. Pt reports not being able to get out of the car to go in the store. Initial heart rate with EMS was 144. Started lexapro 3 days ago for anxiety.

## 2020-11-09 ENCOUNTER — Encounter: Payer: Self-pay | Admitting: Family Medicine

## 2020-11-09 MED ORDER — ALBUTEROL SULFATE HFA 108 (90 BASE) MCG/ACT IN AERS
2.0000 | INHALATION_SPRAY | RESPIRATORY_TRACT | Status: DC | PRN
  Administered 2020-11-09: 2 via RESPIRATORY_TRACT
  Filled 2020-11-09: qty 6.7

## 2020-11-09 MED ORDER — AEROCHAMBER PLUS W/MASK MISC
1.0000 | Freq: Once | Status: AC
  Administered 2020-11-09: 1
  Filled 2020-11-09: qty 1

## 2020-11-09 NOTE — Assessment & Plan Note (Addendum)
PHQ-9 and GAD reviewed and positive for depression and anxiety.  Noted tachycardia on exam heart rate 120.  Of note she reports having some atypical chest pain when feeling overwhelmed but is currently not having any chest pain today.  She is otherwise asymptomatic today.  No suicidal ideation. -EKG shows sinus tach  -BMP today to rule out electrolyte abn -Likely tachycardia related to anxiety, if continues to remain tachycardic can consider further work-up. -We will start Lexapro 10 mg daily -CCM referral for mental health resources, patient agreeable -Mental health resources also provided in AVS as well as 24-hour crisis line -Follow-up appointment made with me December 17

## 2020-11-15 ENCOUNTER — Telehealth: Payer: Medicaid Other

## 2020-11-15 ENCOUNTER — Telehealth: Payer: Self-pay | Admitting: Licensed Clinical Social Worker

## 2020-11-15 NOTE — Chronic Care Management (AMB) (Signed)
    Clinical Social Work  Care Management Phone Outreach    11/15/2020 Name: Vickie Gonzales MRN: [id number] DOB: Aug 05, 1986  Vickie Gonzales is a 34 y.o. year old female who is a primary care patient of Towanda Octave, MD . Patient is enrolled in a Managed Medicaid Health Plan: No  F/U phone call to Vickie Gonzales today to assess needs, and progress with care plan goals.  Patient would like to reschedule call.  Plan: Telephone follow up appointment with care management team member scheduled for: Dec. 21, 2021  Review of patient status, including review of consultants reports, relevant laboratory and other test results, and collaboration with appropriate care team members and the patient's provider was performed as part of comprehensive patient evaluation and provision of care management services.    Sammuel Hines, LCSW Care Management & Coordination  [facility] / [facility]   [phone] 9:44 AM

## 2020-11-19 ENCOUNTER — Telehealth: Payer: Self-pay | Admitting: Licensed Clinical Social Worker

## 2020-11-19 ENCOUNTER — Telehealth: Payer: Medicaid Other

## 2020-11-19 NOTE — Chronic Care Management (AMB) (Signed)
    Clinical Social Work  Care Management  Unsuccessful Phone Outreach    11/19/2020 Name: Vickie Gonzales MRN: [id number] DOB: 04-02-1986  Vickie Gonzales is a 34 y.o. year old female who is a primary care patient of Towanda Octave, MD . Patient is enrolled in a Managed Medicaid Health Plan: No  F/U phone call to Vickie Gonzales today to assess needs, and progress with care plan goals.  Unable to leave a HIPPA compliant phone message due to voice mail not set up or no answer.  LCSW did reach out to [facility] for an updated on the referral. They confirm the did receive the referral and would be reaching out to patient  Plan: LCSW will attempt out reach later today.  If unable to reach patient will f/u in 2 to 3 weeks  Review of patient status, including review of consultants reports, relevant laboratory and other test results, and collaboration with appropriate care team members and the patient's provider was performed as part of comprehensive patient evaluation and provision of care management services.    Sammuel Hines, LCSW Care Management & Coordination  [facility] / [facility]   [phone] 11:53 AM

## 2020-11-25 ENCOUNTER — Ambulatory Visit: Payer: Medicaid Other | Admitting: Family Medicine

## 2020-11-25 NOTE — Progress Notes (Deleted)
    SUBJECTIVE:   CHIEF COMPLAINT / HPI: f/u anxiety/depression  Started on Lexapro 10 mg at last visit.  Tolerating medication well.  Denies any SI/HI.  Has been seen by CCM and reports ***  PERTINENT  PMH / PSH:  Anxiety Depression   OBJECTIVE:   There were no vitals taken for this visit.   General: Alert, no acute distress Cardio: Normal S1 and S2, RRR, no r/m/g Pulm: CTAB, normal work of breathing Abdomen: Bowel sounds normal. Abdomen soft and non-tender.  Extremities: No peripheral edema.  Neuro: Cranial nerves grossly intact   ASSESSMENT/PLAN:   No problem-specific Assessment & Plan notes found for this encounter.     Dana Allan, MD [facility]

## 2020-12-05 ENCOUNTER — Ambulatory Visit: Payer: Medicaid Other | Admitting: Licensed Clinical Social Worker

## 2020-12-05 ENCOUNTER — Other Ambulatory Visit: Payer: Self-pay | Admitting: Family Medicine

## 2020-12-05 DIAGNOSIS — F419 Anxiety disorder, unspecified: Secondary | ICD-10-CM

## 2020-12-05 DIAGNOSIS — Z7189 Other specified counseling: Secondary | ICD-10-CM

## 2020-12-05 DIAGNOSIS — F439 Reaction to severe stress, unspecified: Secondary | ICD-10-CM

## 2020-12-05 NOTE — Chronic Care Management (AMB) (Signed)
Care Management   Clinical Social Work Follow UpNote  12/05/2020 Name: Vickie Gonzales MRN: [id number] DOB: 1986-11-17 Vickie Gonzales is a 35 y.o. year old female who sees Lattie Haw, MD for primary care.  Patient is enrolled in a Managed Medicaid plan: No.  Engaged with patient by telephone today in response to provider referral for social work case management and/or care coordination services. See care plan below for details during this encounter.  Follow up Plan:12/18/2020  Advanced Directives Status:Not addressed in this encounter.     SDOH (Social Determinants of Health) assessments performed: Yes no new needs identified    Patient Care Plan: Social Work / LCSW  Problem Identified: Emotional Distress   Goal: Over the next 60 days, patient will work with SW to reduce or manage symptoms of anxiety, depression, and stress until connected for ongoing counseling.   Start Date: 11/08/2020  Expected End Date: 02/27/2021  Recent Progress: On track  Priority: High  Assessment, progress and current barriers: Patient is doing better with managing her emotions. Reports taking last dose of Lexapro and needs a refill.   She continues to have difficulty sleeping and getting about 2 hours of sleep per night. Has implemented relaxed breathing and spending time with family due to feeling lonely.    Informed patient [facility] reached out to her but her voice mail was not set up. . experiencing symptoms of depression which seems to be exacerbated by social stressors of being out of work, behind in bills and physical health concerns.   . history of counseling about 6 years ago. Just restarted Lexapro 80m 3 days ago. Reports no missed doses. .Leodis Liverpoolknowledge of where and how to connect for ongoing counseling due to not having insurance, needs Support, Education, and Care Coordination in order to meet unmet need.  Clinical Interventions:  . Assessed patient's needs, coping skills, current  treatment, support system and barriers to care . Provided basic mental health support, education and interventions ( EMMI education :Breathing to relax; check list for better sleep and INSOMNIA AND GETTING A GOOD NIGHT'S SLEEP ) . [facility]; Assisted patient with calling but not able to reach anyone. . Reviewed mental health medications with patient prescribed by PCP and discussed compliance  . Other interventions include: Motivational Interviewing, Solution-Focused Strategies, Mindfulness or RPsychologist, educational BVeterinary surgeon Sleep Hygiene, and Psychoeducation and/or Health Education . Reminded patient of appointment with provider 12/09/20  Patient Goals/Self-Care Activities: Over the next 14 days . implement interventions discussed today to decreases symptoms of anxiety, depression, and stress and increase knowledge and/or ability of: coping skills, self-management skills, and stress reduction. . Review EMMI educational information (Breathing to relax; check list for better sleep and INSOMNIA AND GETTING A GOOD NIGHT'S SLEEP) . [facility] . Call DSS to check on your application for assistance  . Work on sPaediatric nurse . Call Nurse line to request refill on lexapro Follow Up Plan:  Appointment scheduled for SW follow up with client by phone on:  12/18/2020   Problem Identified: Barriers to Treatment   Goal: Over the next 30 days, patient will  explore options discussed to resolve barriers to care for rental assistance and health insurance   Start Date: 11/08/2020  Expected End Date: 02/27/2021  Recent Progress: On track  Priority: High  Assessment, progress & current barriers:  Patient lost  job/ covid related and needs assistance with rent.   . She has contacted the [place] and waiting to hear from them. As of today she  has not received an update on her application  . Also does not have health insurance due to job loss.  . Acknowledges deficits with meeting this unmet need Interventions:  . Assessment of needs, barriers , progress, agencies contacted, as well as how impacting  . Provided patient with information about [employer] program for rental assistance and  . DSS to apply for Medicaid she has minor children in the home and has no income . Other interventions provided:Solution-Focused Strategies, Emotional/Supportive Counseling, and Problem Solving Patient Goals/Self-Care Activities: Over the next 14 days . Call DSS to inquire about [employer] program for rental assistance  . Call [place] to check on rental assistance application  . Apply for Medicaid Follow Up Plan: SW will follow up with patient by phone over the next 7 to 10 days     Outpatient Encounter Medications as of 12/05/2020  Medication Sig  . blood glucose meter kit and supplies KIT Dispense based on patient and insurance preference. Use up to four times daily as directed. (FOR ICD-9 250.00, 250.01).  Marland Kitchen escitalopram (LEXAPRO) 5 MG tablet Take 2 tablets (10 mg total) by mouth daily.  Marland Kitchen gabapentin (NEURONTIN) 100 MG capsule Take 1 capsule (100 mg total) by mouth 3 (three) times daily.  . Multiple Vitamin (MULTIVITAMIN WITH MINERALS) TABS tablet Take 1 tablet by mouth daily.  . [DISCONTINUED] metFORMIN (GLUCOPHAGE-XR) 500 MG 24 hr tablet Take 1 tablet (500 mg total) by mouth daily with breakfast. (Patient not taking: Reported on 05/27/2019)   No facility-administered encounter medications on file as of 12/05/2020.    Review of patient status, including review of consultants reports, relevant laboratory and other test results, and collaboration with appropriate care team members and the patient's provider was performed as part of comprehensive patient evaluation and provision of chronic care management services.       Information about Care  Management services was shared with [name] today including:  1. Care Management services include personalized support from designated clinical staff supervised by her physician, including individualized plan of care and coordination with other care providers 2. Remind patient of 24/7 contact phone numbers to provider's office for assistance with urgent and routine care needs. 3. Care Management services are voluntary and patient may stop at any time .   Patient agreed to services provided today and verbal consent obtained.     Casimer Lanius, [place] / [place]   [phone] 9:56 AM

## 2020-12-05 NOTE — Patient Instructions (Signed)
  Vickie Gonzales  it was nice speaking with you. Please call me directly [phone] if you have questions about the goals we discussed. Goals Addressed            This Visit's Progress   . Begin and Stick with Counseling-Depression   Not on track    Timeframe:  Short-Term Goal Priority:  High Start Date:    11/08/2020                         Expected End Date:  02/27/2021                    Follow Up Date 01/019/2022  Patient Goals/Self-Care Activities: Over the next 14 days . implement interventions discussed today to decreases symptoms of anxiety, depression, and stress and increase knowledge and/or ability of: coping skills, self-management skills, and stress reduction. . Review EMMI educational information (Breathing to relax; check list for better sleep and INSOMNIA AND GETTING A GOOD NIGHT'S SLEEP) . Call [facility] to schedule appointment . Call DSS to check on your application for assistance  . Work on Occupational hygienist  . Call Nurse line to request refill on lexapro    . Find Help in My Community       Timeframe:  Short-Term Goal Priority:  High Start Date:   11/08/2020                         Expected End Date:  12/30/2020                     Follow Up Date 12/18/2020   Patient Goals/Self-Care Activities: Over the next 14 days . Call DSS to inquire about [employer] program for rental assistance  . Call [place] to check on rental assistance application  . Apply for Medicaid    Vickie Gonzales received Care Management services today:  1. Care Management services include personalized support from designated clinical staff supervised by her physician, including individualized plan of care and coordination with other care providers 2. 24/7 contact [phone] for assistance for urgent and routine care needs. 3. Care Management are voluntary services and be declined at any time by calling the office.  Patient verbalizes  understanding of instructions provided today.    Follow up plan: SW will follow up with patient by phone over the next two weeks  Soundra Pilon, LCSW

## 2020-12-09 ENCOUNTER — Ambulatory Visit: Payer: Medicaid Other | Admitting: Family Medicine

## 2020-12-09 NOTE — Patient Instructions (Incomplete)
Thank you for coming to see me today. It was a pleasure. Today we talked about:   Continue Lexapro 10 mg daily I would encourage you to seek therapy to help manage your anxiety  Please follow-up with PCP as needed  If you have any questions or concerns, please do not hesitate to call the office at [phone].  Best,   Dana Allan, MD   Outpatient Mental Health Providers (No Insurance required or Self Pay)  [facility]  [address] [place], [place] [phone] Crisis [phone]  [facility]) can see uninsured folks for outpatient therapy [email or web address] [address] [place], [place] [address] [phone]  [facility]    Walk-in Mon-Fri, 8am-3pm [email or web address] Scull [address], [place], [place]  [phone]   [address]  [place] [phone] [facility] for psych med management, there may be a wait- if [facility] is working with clients for OPT, they will coordinate with [facility] for psych  [facility]   Walk-in-Clinic: Monday- Friday 9:00 AM - 4:00 PM [address]   [place], [place] [phone]  [facility] the [place] ([employer]) walk in M-F 8am-12pm and  1pm-3pm [place]- [address]     [phone]  [employer] -[address]  Phone: [phone]  [employer] (Mental Health and substance challenges) [address]   [place] [phone]    [email or web address]    [facility]  [place] -[address], [place]     Phone:  [phone] [place]-  [address]  [phone]   [facility]  [address] [place]  [phone] [email or web address]   [facility] ( virtual or zoom therapy) [email or web address]  [address] [place]  [phone]    [facility] [phone]  grief  counseling, dementia and caregiver support    [facility] Walk-in MWF 12:30 to 3:00     [address] [place] [address]  [phone]  www.[email or web address] call to schedule an appointment    [facility] Classes ,Support group, Peer support services, [address], [place], [place] [address] [phone]  PhotoSolver.pl           National Alliance on Mental Illness (NAMI) [place]- Wellness classes, Support groups        [address] [place], [place] [address] [phone]   [email or web address]   [facility]  (Psycho-social Rehabilitation clubhouse, Individual and group therapy) [address] [place], [place] [address]   [phone]  24- Hour Availability:  [facility] [phone] or 1-2727613386 * [facility] the [employer] (Domestic Violence, Rape, etc. )[phone] Vesta Mixer 8252247569 or [phone] * [facility] [place] [facility] Services [phone] only) [phone] (after hours) *[facility] Mobile Crisis Unit 4182969633 *[place] National Suicide Hotline [phone] Len Childs)

## 2020-12-09 NOTE — Progress Notes (Deleted)
    SUBJECTIVE:   CHIEF COMPLAINT / HPI:   Depression/Anxiety Was started on Lexapro 10 mg at last visit.  She did not start taking it until Jan 3  PERTINENT  PMH / PSH: ***  OBJECTIVE:   There were no vitals taken for this visit.   General: Alert, no acute distress Cardio: Normal S1 and S2, RRR, no r/m/g Pulm: CTAB, normal work of breathing Abdomen: Bowel sounds normal. Abdomen soft and non-tender.  Extremities: No peripheral edema.  Neuro: Cranial nerves grossly intact   ASSESSMENT/PLAN:   No problem-specific Assessment & Plan notes found for this encounter.     Vickie Allan, MD [facility]

## 2020-12-18 ENCOUNTER — Telehealth: Payer: Medicaid Other

## 2020-12-19 ENCOUNTER — Telehealth: Payer: Self-pay | Admitting: Licensed Clinical Social Worker

## 2020-12-19 ENCOUNTER — Telehealth: Payer: Medicaid Other

## 2020-12-19 NOTE — Chronic Care Management (AMB) (Signed)
    Clinical Social Work  Care Management  Unsuccessful Phone Outreach   12/19/2020 Name: [name] MRN: [id number] DOB: September 08, 1986  [name] is a 35 y.o. year old female who is a primary care patient of Towanda Octave, MD . A second unsuccessful telephone outreach was attempted today. The patient was referred to the case management team for assistance with care management and care coordination.   F/U phone call today to assess needs, and progress with care plan goals.   Phone appointment scheduled 11/1920, when I called patient she asked to rescheduled for 12/19/20.   Called twice today, unable to leave a HIPPA compliant phone message due to voice mail not set up.  Plan: Will route to CCM Care Guide to see if patient would like to reschedule.   Review of patient status, including review of consultants reports, relevant laboratory and other test results, and collaboration with appropriate care team members and the patient's provider was performed as part of comprehensive patient evaluation and provision of care management services.    Sammuel Hines, LCSW Care Management & Coordination  [facility] / [facility]   [phone] 11:36 AM

## 2020-12-20 ENCOUNTER — Telehealth: Payer: Self-pay | Admitting: *Deleted

## 2020-12-20 NOTE — Chronic Care Management (AMB) (Signed)
  Care Management   Note  12/20/2020 Name: Vickie Gonzales MRN: [id number] DOB: 07/12/1986  Vickie Gonzales is a 35 y.o. year old female who is a primary care patient of Towanda Octave, MD and is actively engaged with the care management team. I reached out to Vickie Gonzales by phone today to assist with re-scheduling a follow up visit with the Licensed Clinical Social Worker  Follow up plan: Unsuccessful telephone outreach attempt made. The care management team will reach out to the patient again over the next 7 days. If patient returns call to provider office, please advise to call Embedded Care Management Care Guide Gwenevere Ghazi at [phone].  Gwenevere Ghazi  Care Guide, Embedded Care Coordination [facility] Management

## 2020-12-25 NOTE — Chronic Care Management (AMB) (Signed)
  Care Management   Note  12/25/2020 Name: [name] MRN: [id number] DOB: 1986-09-21  Vickie Gonzales is a 35 y.o. year old female who is a primary care patient of Towanda Octave, MD and is actively engaged with the care management team. I reached out to Vickie Gonzales by phone today to assist with re-scheduling a follow up visit with the Licensed Clinical Social Worker  Follow up plan: Telephone appointment with care management team member scheduled for:12/31/2020  Gwenevere Ghazi  Care Guide, Embedded Care Coordination [facility]  Care Management

## 2020-12-31 ENCOUNTER — Telehealth: Payer: Self-pay | Admitting: Licensed Clinical Social Worker

## 2020-12-31 ENCOUNTER — Telehealth: Payer: Medicaid Other

## 2020-12-31 NOTE — Chronic Care Management (AMB) (Signed)
    Clinical Social Work  Care Management  Unsuccessful Phone Outreach    12/31/2020 Name: Vickie Gonzales MRN: [id number] DOB: 1986-11-10  [name] is a 35 y.o. year old female who is a primary care patient of Towanda Octave, MD .   F/U phone call today to assess needs, and progress with care plan goals.  Telephone outreach was unsuccessful called twice.  Plan:Unable to leave a HIPPA compliant phone message due to voice mail not set up.  Will outreach again 5 to 7 days  Review of patient status, including review of consultants reports, relevant laboratory and other test results, and collaboration with appropriate care team members and the patient's provider was performed as part of comprehensive patient evaluation and provision of care management services.    Sammuel Hines, LCSW Care Management & Coordination  [facility] / [facility]   [phone] 10:51 AM

## 2021-01-02 ENCOUNTER — Telehealth: Payer: Self-pay | Admitting: Licensed Clinical Social Worker

## 2021-01-02 NOTE — Chronic Care Management (AMB) (Signed)
    Clinical Social Work  Care Management  Unsuccessful Phone Outreach    01/02/2021 Name: Vickie Gonzales MRN: [id number] DOB: [birth year]  Vickie Gonzales is a 35 y.o. year old female who is a primary care patient of Towanda Octave, MD .   Third unsuccessful telephone outreach was attempted today since making initial contact with patient.  The patient was referred to the case management team for assistance with care management and care coordination. The patient's primary care provider has been notified of our unsuccessful attempts to make or maintain contact with the patient. The care management team is pleased to engage with this patient at any time in the future should he/she be interested in assistance from the care management team.   Intervention: Collaboration with [facility] [phone] ref referral placed for counseling and medication management.  They have not been able to reach patient due to voice mail not being set up.  Informed patient to call and schedule appointment   Plan:Unable to leave a HIPPA compliant phone message due to voice mail not set up. Will stay connect to care team and reach out to patient again over the next 30 days  Review of patient status, including review of consultants reports, relevant laboratory and other test results, and collaboration with appropriate care team members and the patient's provider was performed as part of comprehensive patient evaluation and provision of care management services.    Sammuel Hines, LCSW Care Management & Coordination  [facility] / [facility]   [phone] 10:24 AM

## 2021-01-07 ENCOUNTER — Other Ambulatory Visit: Payer: Self-pay | Admitting: Family Medicine

## 2021-01-07 DIAGNOSIS — F419 Anxiety disorder, unspecified: Secondary | ICD-10-CM

## 2021-01-10 ENCOUNTER — Ambulatory Visit: Payer: Medicaid Other | Admitting: Licensed Clinical Social Worker

## 2021-01-10 ENCOUNTER — Ambulatory Visit: Payer: Medicaid Other | Admitting: Family Medicine

## 2021-01-10 NOTE — Patient Instructions (Signed)
Visit Information  Goals Addressed            This Visit's Progress   . COMPLETED: Find Help in My Community       Timeframe:  Short-Term Goal Priority:  High Start Date:   11/08/2020                         Expected End Date:  12/30/2020                     Follow Up Date 12/18/2020    Patient Goals/Self-Care Activities: Over the next 14 days . Call DSS to inquire about [employer] program for rental assistance  . Call [place] to check on rental assistance application  . Apply for Medicaid    . Reduce symptoms of anxiety and depresion       Timeframe:  Short-Term Goal Priority:  High Start Date:    11/08/2020                         Expected End Date:  02/27/2021                    Follow Up Date 02/018/2022  Patient Goals/Self-Care Activities:  . implement interventions discussed today to decreases symptoms of anxiety, depression, and stress and increase knowledge and/or ability of: coping skills, self-management skills, and stress reduction. . Review EMMI educational information (  Movement for Emotional Health) . Call [facility] to schedule appointment for counseling . Work on Mudlogger       Patient verbalizes understanding of instructions provided today and agrees to view in [place].   Telephone follow up appointment with care management team member scheduled for:01/17/2021  Soundra Pilon, LCSW

## 2021-01-10 NOTE — Chronic Care Management (AMB) (Signed)
Care Management Clinical Social Work Note  01/10/2021 Name: Vickie Gonzales MRN: [id number] DOB: 1986-05-21  Vickie Gonzales is a 35 y.o. year old female who is a primary care patient of Lattie Haw, MD.  The Care Management team was consulted for assistance with chronic disease management and coordination needs.  Engaged with patient by telephone for follow up visit in response to provider referral for social work chronic care management and care coordination services  Consent to Services:  Patient agreed to services and consent obtained.   Assessment: Patient is making progress with managing symptoms of anxiety and depression Has not been able to connect with [facility].  Able to get assistance with rent from [place] which assisted with stress.  Has been able to implement interventions to manage symptoms. Continues to have difficulty with get going in the morning and has days she does not feel like doing anything. See Care Plan below for interventions and patient self-care actives. Recommendation: Patient may benefit from, and is in agreement to implement self-care action plan.   Follow up Plan: Patient would like continued follow-up.  CCM LCSW will f/u with patient in 1 week  01/17/2021. Patient will call office if needed prior to next encounter    Review of patient past medical history, allergies, medications, and health status, including review of relevant consultants reports was performed today as part of a comprehensive evaluation and provision of chronic care management and care coordination services.  SDOH (Social Determinants of Health) assessments and interventions performed:    Advanced Directives Status: Not addressed in this encounter.  Care Plan  No Known Allergies  Outpatient Encounter Medications as of 01/10/2021  Medication Sig  . blood glucose meter kit and supplies KIT Dispense based on patient and insurance preference. Use up to four times daily as  directed. (FOR ICD-9 250.00, 250.01).  Marland Kitchen escitalopram (LEXAPRO) 5 MG tablet Take 2 tablets by mouth once daily  . gabapentin (NEURONTIN) 100 MG capsule Take 1 capsule (100 mg total) by mouth 3 (three) times daily.  . Multiple Vitamin (MULTIVITAMIN WITH MINERALS) TABS tablet Take 1 tablet by mouth daily.  . [DISCONTINUED] metFORMIN (GLUCOPHAGE-XR) 500 MG 24 hr tablet Take 1 tablet (500 mg total) by mouth daily with breakfast. (Patient not taking: Reported on 05/27/2019)   No facility-administered encounter medications on file as of 01/10/2021.    Patient Active Problem List   Diagnosis Date Noted  . No-show for appointment 10/29/2020  . Anxiety 10/23/2020  . Abscess 08/29/2020  . Left leg numbness 04/26/2020  . Neck pain 04/26/2020  . Back pain 03/15/2020  . Panic disorder 02/15/2020  . Stress 11/03/2018  . Hypertension 03/31/2017  . Healthcare maintenance 10/01/2016  . TOBACCO USER 08/17/2009  . HYPERLIPIDEMIA 07/24/2008  . 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 Diabetes ([place]) 01/27/2007  . OBESITY, NOS 01/27/2007    Conditions to be addressed/monitored: Anxiety, Depression and stress; Financial constraints related to being out of work  Care Plan : Social Work / [employer]  Updates made by Maurine Cane, LCSW since 01/10/2021 12:00 AM  Problem: Emotional Distress   Goal: Over the next 60 days, patient will work with SW to reduce or manage symptoms of anxiety, depression, and stress until connected for ongoing counseling.   Start Date: 11/08/2020  Expected End Date: 02/27/2021  Recent Progress: On track  Priority: High  Current barriers:     Marland Kitchen [facility] has not been able to reach patient to schedule therapy  appointment. . Experiencing symptoms of depression which seems to be exacerbated by social stressors of being out of work, behind in bills and physical health concerns.   . history of counseling about 6 years ago. Just restarted Lexapro 15m 3 days ago. Reports no  missed doses. .Leodis Liverpoolknowledge of where and how to connect for ongoing counseling due to not having insurance, needs Support, Education, and Care Coordination in order to meet unmet need.  Clinical Interventions:  . Assessed patient's needs, coping skills, progress, support system and barriers to care . Provided basic mental health support, education and interventions ( EMMI education : on Self-Care Action Plan and Movement for Emotional Health) . Reviewed Breathing to relax; check list for better sleep and INSOMNIA AND GETTING A GOOD NIGHT'S SLEEP ) . [facility]; provided her the phone number to call . Reviewed mental health medications with patient prescribed by PCP and discussed compliance; ( reports on one missed dose yesterday)  . Other interventions include: Motivational Interviewing, Solution-Focused Strategies, Goal setting, Behavioral Activation, and Psychoeducation and/or Health Education Patient Goals/Self-Care Activities: Over the next 14 days . implement interventions discussed today to decreases symptoms of anxiety, depression, and stress and increase knowledge and/or ability of: coping skills, self-management skills, and stress reduction. . Review EMMI educational information (  Movement for Emotional Health) . [facility] . Work on self-care Action plan  Follow Up Plan:  Appointment scheduled for SW follow up with client by phone on:  01/17/2021   Problem: Barriers to Treatment   Goal: Over the next 30 days, patient will  explore options discussed to resolve barriers to care for rental assistance and health insurance Completed 01/10/2021  Start Date: 11/08/2020  Expected End Date: 02/27/2021  Recent Progress: On track  Priority: High  Assessment, progress & current barriers:  Patient lost job/ covid related and needs assistance with rent.   . [place] As of today she has not received an update on her application  . Also does not have health insurance due to job loss.  . Acknowledges deficits with meeting this unmet need Interventions:  . Assessment of needs, barriers , progress, agencies contacted, as well as how impacting  . Provided patient with information about [facility] for [facility] and  . DSS to apply for Medicaid she has minor children in the home and has no income . Other interventions provided:Solution-Focused Strategies, Emotional/Supportive Counseling, and Problem Solving Patient Goals/Self-Care Activities: Over the next 14 days . Call DSS to inquire about [facility] assistance  . [place]  . Apply for Medicaid Follow Up Plan: SW will follow up with patient by phone over the next 7 to 10 days     DCasimer Lanius LBaxter/ [place]  [phone]:[id number] AM

## 2021-01-10 NOTE — Progress Notes (Deleted)
     SUBJECTIVE:   CHIEF COMPLAINT / HPI:   Vickie Gonzales is a 35 y.o. female presents for anxiety follow up.  Anxiety Usually takes Lexapro 5mg . Reports feeling anxious for ***. Triggers include **. Symptoms exacerbated by ** . Symptoms alleviated by **. Irritability **, worry**, concentration **, restlessness **, energy ** and sleep **.  Other associated symptoms include ** chest pain, ** dyspnea, ** palpitations, ** numbness, **dizziness, ** low mood, ** suicidal ideation, **thoughts of self harm or harm to others.  Has/not taken medications for anxiety before. Has/not tried therapy before.   Diabetes Patient's current diabetic medications include metformin Tolerating well without side effects.  Patient endorses compliance with these medications. CBG readings averaging in the *** range.  Patient's last A1c was  Lab Results  Component Value Date   HGBA1C 6.3 08/29/2020   HGBA1C 7.4 (A) 03/12/2020   HGBA1C 9.6 (A) 11/02/2018   on **.  Current A1c today is***.  Denies abdominal pain, blurred vision, polyuria, polydipsia, hypoglycemia ***. Patient states they understand that diet and exercise can help with her diabetes.***.    Last Microalbumin, LDL, Creatinine: Lab Results  Component Value Date   LDLCALC 119 (H) 08/29/2008   CREATININE 0.79 11/04/2020    PERTINENT  PMH / PSH:   OBJECTIVE:   There were no vitals taken for this visit.   General: Alert, no acute distress Cardio: Normal S1 and S2, RRR, no r/m/g Pulm: CTAB, normal work of breathing Abdomen: Bowel sounds normal. Abdomen soft and non-tender.  Extremities: No peripheral edema.  Neuro: Cranial nerves grossly intact   ASSESSMENT/PLAN:   No problem-specific Assessment & Plan notes found for this encounter.     14/04/2020, MD PGY-2 [facility]

## 2021-01-17 ENCOUNTER — Telehealth: Payer: Medicaid Other

## 2021-01-17 ENCOUNTER — Telehealth: Payer: Self-pay | Admitting: Licensed Clinical Social Worker

## 2021-01-17 NOTE — Chronic Care Management (AMB) (Signed)
    Clinical Social Work  Care Management Phone Outreach    01/17/2021 Name: Vickie Gonzales MRN: [id number] DOB: [birth year]  [name] is a 35 y.o. year old female who is a primary care patient of Towanda Octave, MD .   F/U phone call today to assess needs, and progress with care plan goals.    Plan:Patient requested to reschedule for 01/21/21  Review of patient status, including review of consultants reports, relevant laboratory and other test results, and collaboration with appropriate care team members and the patient's provider was performed as part of comprehensive patient evaluation and provision of care management services.    Sammuel Hines, LCSW Care Management & Coordination  [facility] / [facility]   [phone] 9:20 AM

## 2021-01-21 ENCOUNTER — Telehealth: Payer: Self-pay | Admitting: Licensed Clinical Social Worker

## 2021-01-21 ENCOUNTER — Telehealth: Payer: Medicaid Other

## 2021-01-21 NOTE — Chronic Care Management (AMB) (Signed)
    Clinical Social Work  Care Management  Phone Outreach    01/21/2021 Name: Vickie Gonzales MRN: [id number] DOB: [birth year]  Vickie Gonzales is a 35 y.o. year old female who is a primary care patient of Towanda Octave, MD .   F/U phone call today to assess needs, and progress with care plan goals.  Patient requested to reschedule phone appointment   Plan: Phone appointment scheduled 01/22/21 at 10:00  Review of patient status, including review of consultants reports, relevant laboratory and other test results, and collaboration with appropriate care team members and the patient's provider was performed as part of comprehensive patient evaluation and provision of care management services.    Soundra Pilon, LCSW

## 2021-01-22 ENCOUNTER — Ambulatory Visit: Payer: Medicaid Other | Admitting: Licensed Clinical Social Worker

## 2021-01-22 DIAGNOSIS — F439 Reaction to severe stress, unspecified: Secondary | ICD-10-CM

## 2021-01-22 DIAGNOSIS — Z7189 Other specified counseling: Secondary | ICD-10-CM

## 2021-01-22 NOTE — Chronic Care Management (AMB) (Signed)
Care Management Clinical Social Work Note  01/22/2021 Name: Vickie Gonzales MRN: [id number] DOB: 1986/10/08  Vickie Gonzales is a 35 y.o. year old female who is a primary care patient of Lattie Haw, MD.  The Care Management team was consulted for assistance with chronic disease management and coordination needs.  Engaged with patient by telephone for follow up visit in response to provider referral for social work chronic care management and care coordination services  Consent to Services:  Patient agreed to services and consent obtained.   Assessment: Patient continues to experience difficulty with managing symptoms of depression.She has noticed an improvement in her appetite and starting to see a little improve over all.  Understands the importance of connecting with a therapist. She continues to take Lexapro with no missed doses.  Will schedule f/u with PCP and counseling appointment.  See Care Plan below for interventions and patient self-care actives. Recent life changes /stressors: not working Follow up Plan: Patient would like continued follow-up.  CCM LCSW will contineu to provide bridge support until patient is connected for ongoing support. Next outreach scheduled 01/28/21. Patient will call office if needed prior to next encounter    Review of patient past medical history, allergies, medications, and health status, including review of relevant consultants reports was performed today as part of a comprehensive evaluation and provision of chronic care management and care coordination services.  SDOH (Social Determinants of Health) assessments and interventions performed:    Advanced Directives Status: Not addressed in this encounter.  Care Plan  No Known Allergies  Outpatient Encounter Medications as of 01/22/2021  Medication Sig  . blood glucose meter kit and supplies KIT Dispense based on patient and insurance preference. Use up to four times daily as directed. (FOR ICD-9 250.00,  250.01).  Marland Kitchen escitalopram (LEXAPRO) 5 MG tablet Take 2 tablets by mouth once daily  . gabapentin (NEURONTIN) 100 MG capsule Take 1 capsule (100 mg total) by mouth 3 (three) times daily.  . Multiple Vitamin (MULTIVITAMIN WITH MINERALS) TABS tablet Take 1 tablet by mouth daily.  . [DISCONTINUED] metFORMIN (GLUCOPHAGE-XR) 500 MG 24 hr tablet Take 1 tablet (500 mg total) by mouth daily with breakfast. (Patient not taking: Reported on 05/27/2019)   No facility-administered encounter medications on file as of 01/22/2021.    Patient Active Problem List   Diagnosis Date Noted  . No-show for appointment 10/29/2020  . Anxiety 10/23/2020  . Abscess 08/29/2020  . Left leg numbness 04/26/2020  . Neck pain 04/26/2020  . Back pain 03/15/2020  . Panic disorder 02/15/2020  . Stress 11/03/2018  . Hypertension 03/31/2017  . Healthcare maintenance 10/01/2016  . TOBACCO USER 08/17/2009  . HYPERLIPIDEMIA 07/24/2008  . 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 Diabetes ([place]) 01/27/2007  . OBESITY, NOS 01/27/2007    Conditions to be addressed/monitored: Anxiety and Depression; Financial constraints related to being out of work  Care Plan : Social Work / [employer]  Updates made by Maurine Cane, LCSW since 01/22/2021 12:00 AM  Problem: Emotional Distress   Goal: Over the next 60 days, patient will work with SW to reduce or manage symptoms of anxiety, depression, and stress until connected for ongoing counseling.   Start Date: 11/08/2020  Expected End Date: 02/27/2021  This Visit's Progress: On track  Recent Progress: On track  Priority: High  Current barriers:     Marland Kitchen [facility] has not been able to reach patient to schedule therapy appointment; confused about various options when she called .  Experiencing symptoms of depression which seems to be exacerbated by social stressors of being out of work, behind in bills and physical health concerns.   . history of counseling about 6 years ago. Just  restarted Lexapro 52m 3 days ago. Reports no missed doses. .Leodis Liverpoolknowledge of where and how to connect for ongoing counseling due to not having insurance, needs Support, Education, and Care Coordination in order to meet unmet need.  .Ernst Spellrepair Clinical Interventions:  . Assessed patient's needs, coping skills, progress, support system and barriers to care . Provided basic mental health support, education and interventions ( EMMI education : on Self-Care Action Plan and Movement for Emotional Health) . Reviewed Breathing to relax; check list for better sleep and INSOMNIA AND GETTING A GOOD NIGHT'S SLEEP ) . [facility]ed [facility] made sure she has phone number to call . Reviewed mental health medications with patient prescribed by PCP and discussed compliance; ( reports no missed dose)  . Other interventions include: Motivational Interviewing, Solution-Focused Strategies, Goal setting, BVeterinary surgeon and Psychoeducation and/or Health Education . Reminded patient to schedule f/u with PCP ( missed last appointment) Patient Goals/Self-Care Activities: Over the next 14 days . implement interventions discussed today to decreases symptoms of anxiety, depression, and stress and increase knowledge and/or ability of: coping skills, self-management skills, and stress reduction. . Review EMMI educational information (Movement for Emotional Health) . [facility] . Work on self-care Action plan  Follow Up Plan:  Appointment scheduled for SW follow up with client by phone on:  01/28/2021     DCasimer Lanius [place]/ [place]  [phone] AM

## 2021-01-22 NOTE — Patient Instructions (Signed)
  Ms. Napierkowski  it was nice speaking with you. Please call me directly [phone] if you have questions about the goals we discussed. Goals Addressed            This Visit's Progress   . Reduce symptoms of anxiety and depression   On track    Timeframe:  Short-Term Goal Priority:  High Start Date:    11/08/2020                         Expected End Date:  04/29/2021                    Follow Up Date 01/28/2021  Patient Goals/Self-Care Activities:  . implement interventions discussed today to decreases symptoms of anxiety, depression, and stress and increase knowledge and/or ability of: coping skills, self-management skills, and stress reduction. . Review EMMI educational information (  Movement for Emotional Health) . Call [facility] to schedule appointment for counseling . Work on Mudlogger       Ms. Newmann received Care Coordination services today:  1. Care Coordination services include personalized support from designated clinical staff supervised by her physician, including individualized plan of care and coordination with other care providers 2. 24/7 contact [phone] for assistance for urgent and routine care needs. 3. Care Coordination is a voluntary services and be declined at any time by calling the office.  Patient verbalizes understanding of instructions provided today.    Follow up plan: Appointment scheduled for SW follow up with client by phone on: 01/28/21  Soundra Pilon, LCSW

## 2021-02-07 ENCOUNTER — Other Ambulatory Visit: Payer: Self-pay | Admitting: Family Medicine

## 2021-02-07 DIAGNOSIS — F419 Anxiety disorder, unspecified: Secondary | ICD-10-CM

## 2021-03-20 ENCOUNTER — Other Ambulatory Visit: Payer: Self-pay | Admitting: Family Medicine

## 2021-03-20 DIAGNOSIS — F419 Anxiety disorder, unspecified: Secondary | ICD-10-CM

## 2021-03-22 ENCOUNTER — Encounter (HOSPITAL_COMMUNITY): Payer: Self-pay

## 2021-03-22 ENCOUNTER — Emergency Department (HOSPITAL_COMMUNITY)
Admission: EM | Admit: 2021-03-22 | Discharge: 2021-03-22 | Disposition: A | Discharge status: Home / Self Care | Payer: Medicaid Other | Attending: Emergency Medicine | Admitting: Emergency Medicine

## 2021-03-22 ENCOUNTER — Other Ambulatory Visit: Payer: Self-pay

## 2021-03-22 DIAGNOSIS — N611 Abscess of the breast and nipple: Secondary | ICD-10-CM

## 2021-03-22 DIAGNOSIS — E119 Type 2 diabetes mellitus without complications: Secondary | ICD-10-CM | POA: Insufficient documentation

## 2021-03-22 DIAGNOSIS — I10 Essential (primary) hypertension: Secondary | ICD-10-CM | POA: Insufficient documentation

## 2021-03-22 DIAGNOSIS — Z7984 Long term (current) use of oral hypoglycemic drugs: Secondary | ICD-10-CM | POA: Insufficient documentation

## 2021-03-22 DIAGNOSIS — F17210 Nicotine dependence, cigarettes, uncomplicated: Secondary | ICD-10-CM | POA: Insufficient documentation

## 2021-03-22 LAB — CBC WITH DIFFERENTIAL/PLATELET
Abs Immature Granulocytes: 0.04 10*3/uL (ref 0.00–0.07)
Basophils Absolute: 0 10*3/uL (ref 0.0–0.1)
Basophils Relative: 0 %
Eosinophils Absolute: 0.2 10*3/uL (ref 0.0–0.5)
Eosinophils Relative: 2 %
HCT: 37.6 % (ref 36.0–46.0)
Hemoglobin: 12.8 g/dL (ref 12.0–15.0)
Immature Granulocytes: 0 %
Lymphocytes Relative: 30 %
Lymphs Abs: 3.2 10*3/uL (ref 0.7–4.0)
MCH: 34 pg (ref 26.0–34.0)
MCHC: 34 g/dL (ref 30.0–36.0)
MCV: 99.7 fL (ref 80.0–100.0)
Monocytes Absolute: 0.8 10*3/uL (ref 0.1–1.0)
Monocytes Relative: 7 %
Neutro Abs: 6.5 10*3/uL (ref 1.7–7.7)
Neutrophils Relative %: 61 %
Platelets: 340 10*3/uL (ref 150–400)
RBC: 3.77 MIL/uL — ABNORMAL LOW (ref 3.87–5.11)
RDW: 13.7 % (ref 11.5–15.5)
WBC: 10.7 10*3/uL — ABNORMAL HIGH (ref 4.0–10.5)
nRBC: 0 % (ref 0.0–0.2)

## 2021-03-22 LAB — BASIC METABOLIC PANEL
Anion gap: 10 (ref 5–15)
BUN: 7 mg/dL (ref 6–20)
CO2: 23 mmol/L (ref 22–32)
Calcium: 7.9 mg/dL — ABNORMAL LOW (ref 8.9–10.3)
Chloride: 107 mmol/L (ref 98–111)
Creatinine, Ser: 0.45 mg/dL (ref 0.44–1.00)
GFR, Estimated: >60 mL/min (ref >60)
Glucose, Bld: 141 mg/dL — ABNORMAL HIGH (ref 70–99)
Potassium: 3.7 mmol/L (ref 3.5–5.1)
Sodium: 140 mmol/L (ref 135–145)

## 2021-03-22 MED ORDER — SULFAMETHOXAZOLE-TRIMETHOPRIM 800-160 MG PO TABS: 1.0000 | ORAL_TABLET | Freq: Two times a day (BID) | ORAL | 0 refills | Status: AC

## 2021-03-22 MED ORDER — SULFAMETHOXAZOLE-TRIMETHOPRIM 800-160 MG PO TABS
1.0000 | ORAL_TABLET | Freq: Once | ORAL | Status: AC
  Administered 2021-03-22: 1 via ORAL
  Filled 2021-03-22: qty 1

## 2021-03-22 NOTE — ED Triage Notes (Signed)
Pt reports noticing a lump on her left breast about 3 days ago. Pt reports it has become larger since then. Pt reports increased pain with left arm movement.

## 2021-03-22 NOTE — Discharge Instructions (Addendum)
You were seen in the ER today for your breast pain, swelling, redness.  Your physical exam was concerning for infection of the skin, and the ultrasound at the bedside was concerning for fluid collection in your breast which could be indicative of an abscess (collection of infectious fluid).  You have been started on antibiotic therapy to treat this type of infection.  Given your family history and new breast issue, you have been given an ambulatory referral to the breast center with whom you should follow-up in the next week.  Please call and schedule follow-up appointment.  Please also call your primary care doctor first thing Monday morning to be seen next week for reevaluation.  Return to the emergency department if you develop any fevers, chills, nausea or vomiting that does not stop, and enlarging of the lump, drainage, or spreading of the redness to other parts of your breast or chest wall despite being on antibiotics for more than 48 hours.

## 2021-03-22 NOTE — ED Provider Notes (Signed)
[facility] DEPT Provider Note   CSN: [id number] Arrival date & time: 03/22/21  1205     History Chief Complaint  Patient presents with  . Breast Pain    Vickie Gonzales is a 35 y.o. female who presents with concern of left breast lump that 3 days ago and is gradually gotten larger.  Associated redness of the skin and tenderness.  She denies any fever or chills at home, denies any drainage from the breast lump.  She does endorse history of skin infections in her groin and in her axilla bilaterally in the past.  Additionally she expresses concern because she has extensive family history of breast cancer on her maternal side starting in women in their 93s.  She denies any history of mass and states to place close attention to her breast tissue.  She is currently completing her menstrual cycle at this time.  Denies night sweats.  I personally reviewed this patient's medical records.  She has history of diabetes managed at this time with lifestyle modifications.  History of hyperlipidemia and hypertension.  Chest has extensive history of anxiety.  HPI     Past Medical History:  Diagnosis Date  . Diabetes mellitus    On insulin during pregnancy otherwise oral meds  . GERD (gastroesophageal reflux disease)   . Hypertension     Patient Active Problem List   Diagnosis Date Noted  . No-show for appointment 10/29/2020  . Anxiety 10/23/2020  . Abscess 08/29/2020  . Left leg numbness 04/26/2020  . Neck pain 04/26/2020  . Back pain 03/15/2020  . Panic disorder 02/15/2020  . Stress 11/03/2018  . Hypertension 03/31/2017  . Healthcare maintenance 10/01/2016  . TOBACCO USER 08/17/2009  . HYPERLIPIDEMIA 07/24/2008  . 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 Diabetes ([place]) 01/27/2007  . OBESITY, NOS 01/27/2007    Past Surgical History:  Procedure Laterality Date  . CESAREAN SECTION    . CESAREAN SECTION N/A 10/03/2013   Procedure: CESAREAN SECTION;   Surgeon: Emily Filbert, MD;  Location: [facility];  Service: Obstetrics;  Laterality: N/A;     OB History    Gravida  3   Para  2   Term  1   Preterm  1   AB  1   Living  2     SAB      IAB  1   Ectopic      Multiple      Live Births  2           Family History  Problem Relation Age of Onset  . Diabetes Mother   . Cancer Mother        breast cancer    Social History   Tobacco Use  . Smoking status: Current Every Day Smoker    Packs/day: 0.25    Types: Cigarettes  . Smokeless tobacco: Current User  Vaping Use  . Vaping Use: Never used  Substance Use Topics  . Alcohol use: Yes    Comment: occasionally  . Drug use: No    Home Medications Prior to Admission medications   Medication Sig Start Date End Date Taking? Authorizing Provider  sulfamethoxazole-trimethoprim (BACTRIM DS) 800-160 MG tablet Take 1 tablet by mouth 2 (two) times daily for 7 days. 03/22/21 03/29/21 Yes Garielle Mroz R, PA-C  blood glucose meter kit and supplies KIT Dispense based on patient and insurance preference. Use up to four times daily as directed. (FOR ICD-9 250.00, 250.01). 02/11/17  Hedges, Dellis Filbert, PA-C  escitalopram (LEXAPRO) 5 MG tablet Take 2 tablets by mouth once daily 03/20/21   Carollee Leitz, MD  gabapentin (NEURONTIN) 100 MG capsule Take 1 capsule (100 mg total) by mouth 3 (three) times daily. 04/25/20   Guadalupe Dawn, MD  Multiple Vitamin (MULTIVITAMIN WITH MINERALS) TABS tablet Take 1 tablet by mouth daily.    [provider]  metFORMIN (GLUCOPHAGE-XR) 500 MG 24 hr tablet Take 1 tablet (500 mg total) by mouth daily with breakfast. Patient not taking: Reported on 05/27/2019 11/02/18 11/09/20  Bufford Lope, DO    Allergies    Patient has no known allergies.  Review of Systems   Review of Systems  Constitutional: Negative.   HENT: Negative.   Respiratory: Negative.   Cardiovascular: Negative.   Gastrointestinal: Negative.   Musculoskeletal:       Left  breast lump with TTP, redness, no drainage.   Skin: Negative.   Neurological: Negative.   Hematological: Negative.   Psychiatric/Behavioral: Negative.     Physical Exam Updated Vital Signs BP (!) 150/95 (BP Location: Left Arm)   Pulse 100   Temp 98.1 F (36.7 C) (Oral)   Resp 16   LMP 03/17/2021   SpO2 100%   Physical Exam Vitals and nursing note reviewed. Exam conducted with a chaperone present.  Constitutional:      Appearance: She is not toxic-appearing.  HENT:     Head: Normocephalic and atraumatic.     Nose: Nose normal.     Mouth/Throat:     Mouth: Mucous membranes are moist.     Pharynx: Oropharynx is clear. Uvula midline. No oropharyngeal exudate, posterior oropharyngeal erythema or uvula swelling.     Tonsils: No tonsillar exudate.  Eyes:     General: Lids are normal. Vision grossly intact.        Right eye: No discharge.        Left eye: No discharge.     Extraocular Movements: Extraocular movements intact.     Conjunctiva/sclera: Conjunctivae normal.     Pupils: Pupils are equal, round, and reactive to light.  Neck:     Trachea: Trachea and phonation normal.  Cardiovascular:     Rate and Rhythm: Normal rate and regular rhythm.     Pulses: Normal pulses.     Heart sounds: Normal heart sounds. No murmur heard.   Pulmonary:     Effort: Pulmonary effort is normal. No tachypnea, bradypnea, accessory muscle usage or respiratory distress.     Breath sounds: Normal breath sounds. No wheezing or rales.  Chest:     Chest wall: No mass, lacerations, deformity, swelling, tenderness, crepitus or edema.  Breasts:     Right: Normal. No axillary adenopathy or supraclavicular adenopathy.     Left: Mass and tenderness present. No inverted nipple, nipple discharge, axillary adenopathy or supraclavicular adenopathy.     Abdominal:     General: Bowel sounds are normal. There is no distension.     Palpations: Abdomen is soft.     Tenderness: There is no abdominal  tenderness. There is no right CVA tenderness, left CVA tenderness, guarding or rebound.  Musculoskeletal:        General: No deformity.     Cervical back: Neck supple. No rigidity or crepitus. No pain with movement, spinous process tenderness or muscular tenderness.     Right lower leg: No edema.     Left lower leg: No edema.  Lymphadenopathy:     Cervical: No cervical adenopathy.  Upper Body:     Right upper body: No supraclavicular, axillary or pectoral adenopathy.     Left upper body: No supraclavicular, axillary or pectoral adenopathy.  Skin:    General: Skin is warm and dry.  Neurological:     Mental Status: She is alert. Mental status is at baseline.     Sensory: Sensation is intact.     Motor: Motor function is intact.     Gait: Gait is intact.  Psychiatric:        Mood and Affect: Mood normal.     ED Results / Procedures / Treatments   Labs (all labs ordered are listed, but only abnormal results are displayed) Labs Reviewed  BASIC METABOLIC PANEL - Abnormal; Notable for the following components:      Result Value   Glucose, Bld 141 (*)    Calcium 7.9 (*)    All other components within normal limits  CBC WITH DIFFERENTIAL/PLATELET - Abnormal; Notable for the following components:   WBC 10.7 (*)    RBC 3.77 (*)    All other components within normal limits    EKG None  Radiology No results found.  Procedures Procedures   Medications Ordered in ED Medications  sulfamethoxazole-trimethoprim (BACTRIM DS) 800-160 MG per tablet 1 tablet (1 tablet Oral Given 03/22/21 1450)    ED Course  I have reviewed the triage vital signs and the nursing notes.  Pertinent labs & imaging results that were available during my care of the patient were reviewed by me and considered in my medical decision making (see chart for details).    MDM Rules/Calculators/A&P                          34 year old female presents with concern for 3 days of left breast lump with  surrounding redness and tenderness to2 palpation. Differential diagnosis includes is not limited to cellulitis of the breast with or without abscess, neoplasm of the breast, fibrocystic changes secondary to menstrual cycle (currently on her period), cyst.  Vital signs are normal on intake.  Physical exam revealed normal cardiopulmonary exam, breast exam performed in presence of chaperone revealed 3 x 3 cm lesion of the left medial breast near the sternal border within the breast tissue itself with surrounding erythema and tenderness to palpation without any crepitus or sinus tract.  Bedside ultrasound performed with significant collection of fluid within the mass, concerning for abscess.  Given masses within the breast tissue itself rather than on the chest wall, will not drain it here in the emergency department.  Will administer first dose of antibiotic here in the ER and discharged with ambulatory referral to the breast center.  Recommended follow-up closely with PCP early next week as well.  Will discharge with antibiotic course.  No further work-up warranted in the ED at this time.  Vickie Gonzales voiced understanding for medical evaluation and treatment plan.  Each of her questions was answered to her expressed satisfaction.  Return precautions were given.  Patient is well-appearing, stable, and appropriate for discharge at this time.  This chart was dictated using voice recognition software, Dragon. Despite the best efforts of this provider to proofread and correct errors, errors may still occur which can change documentation meaning.  Final Clinical Impression(s) / ED Diagnoses Final diagnoses:  Breast abscess    Rx / DC Orders ED Discharge Orders         Ordered    sulfamethoxazole-trimethoprim (BACTRIM DS) 800-160  MG tablet  2 times daily        03/22/21 1429    Ambulatory referral to Breast Clinic       Comments: Concern for breast abscess versus malignancy, extensive family hx of breast  cancer.   03/22/21 1432           Barbee Mamula, Gypsy Balsam, PA-C 03/22/21 2250    Gareth Morgan, MD 03/25/21 1141

## 2021-04-24 ENCOUNTER — Other Ambulatory Visit: Payer: Self-pay | Admitting: Family Medicine

## 2021-04-24 DIAGNOSIS — F419 Anxiety disorder, unspecified: Secondary | ICD-10-CM

## 2021-05-13 ENCOUNTER — Ambulatory Visit: Payer: Medicaid Other | Admitting: Family Medicine

## 2021-05-13 ENCOUNTER — Other Ambulatory Visit: Payer: Self-pay

## 2021-05-13 ENCOUNTER — Ambulatory Visit
Admission: EM | Admit: 2021-05-13 | Discharge: 2021-05-13 | Disposition: A | Discharge status: Home / Self Care | Payer: Medicaid Other

## 2021-05-15 ENCOUNTER — Ambulatory Visit: Payer: Medicaid Other | Admitting: Family Medicine

## 2021-05-15 NOTE — Progress Notes (Deleted)
    SUBJECTIVE:   CHIEF COMPLAINT / HPI:   Anxiety: ball in chest.  Was previously taking lexapro 10mg  and propranolol 20mg  tid.   PERTINENT  PMH / PSH:   OBJECTIVE:   There were no vitals taken for this visit.  ***  ASSESSMENT/PLAN:   No problem-specific Assessment & Plan notes found for this encounter.     , MD [facility]   {    This will disappear when note is signed, click to select method of visit    :1}

## 2021-05-28 ENCOUNTER — Telehealth: Payer: Self-pay | Admitting: *Deleted

## 2021-05-28 NOTE — Chronic Care Management (AMB) (Signed)
  Care Management   Note  05/28/2021 Name: Vickie Gonzales MRN: [id number] DOB: 08-Aug-1986  Vickie Gonzales is a 35 y.o. year old female who is a primary care patient of Towanda Octave, MD and is actively engaged with the care management team. I reached out to Vickie Gonzales by phone today to assist with scheduling a follow up visit with the Licensed Clinical Social Worker  Follow up plan: Unsuccessful telephone outreach attempt made. A HIPAA compliant phone message was left for the patient providing contact information and requesting a return call.  The care management team will reach out to the patient again over the next 7 days.  If patient returns call to provider office, please advise to call Embedded Care Management Care Guide Gwenevere Ghazi at [phone].  Gwenevere Ghazi  Care Guide, Embedded Care Coordination [facility] Management

## 2021-06-03 NOTE — Chronic Care Management (AMB) (Signed)
  Care Management   Note  06/03/2021 Name: Vickie Gonzales MRN: [id number] DOB: Jun 10, 1986  Vickie Gonzales is a 35 y.o. year old female who is a primary care patient of Towanda Octave, MD and is actively engaged with the care management team. I reached out to Vickie Gonzales by phone today to assist with scheduling a follow up visit with the Licensed Clinical Social Worker  Follow up plan: A second unsuccessful telephone outreach attempt made. A HIPAA compliant phone message was left for the patient providing contact information and requesting a return call. The care management team will reach out to the patient again over the next 7 days. If patient returns call to provider office, please advise to call Embedded Care Management Care Guide Gwenevere Ghazi at [phone].  Gwenevere Ghazi  Care Guide, Embedded Care Coordination [facility] Management

## 2021-06-10 NOTE — Chronic Care Management (AMB) (Signed)
  Care Management   Outreach Note  06/10/2021 Name: Vickie Gonzales MRN: [id number] DOB: Aug 31, 1986  Referred by: Towanda Octave, MD Reason for referral : Care Coordination (Outreach to schedule follow up call with Licensed Clinical SW  )   Third unsuccessful telephone outreach was attempted today. The patient was referred to the case management team for assistance with care management and care coordination. The patient's primary care provider has been notified of our unsuccessful attempts to make or maintain contact with the patient. The care management team is pleased to engage with this patient at any time in the future should he/she be interested in assistance from the care management team.   Follow Up Plan: If patient returns call to provider office, please advise to call Embedded Care Management Care Guide Gwenevere Ghazi* at [phone]  Care Guide, Embedded Care Coordination [facility]  Care Management

## 2021-08-27 ENCOUNTER — Other Ambulatory Visit: Payer: Self-pay

## 2021-08-27 ENCOUNTER — Ambulatory Visit (INDEPENDENT_AMBULATORY_CARE_PROVIDER_SITE_OTHER): Payer: Self-pay | Admitting: Family Medicine

## 2021-08-27 ENCOUNTER — Encounter: Payer: Self-pay | Admitting: Family Medicine

## 2021-08-27 VITALS — BP 128/84 | HR 103 | Ht 65.0 in

## 2021-08-27 DIAGNOSIS — N644 Mastodynia: Secondary | ICD-10-CM

## 2021-08-27 DIAGNOSIS — F339 Major depressive disorder, recurrent, unspecified: Secondary | ICD-10-CM

## 2021-08-27 DIAGNOSIS — R200 Anesthesia of skin: Secondary | ICD-10-CM

## 2021-08-27 DIAGNOSIS — E119 Type 2 diabetes mellitus without complications: Secondary | ICD-10-CM

## 2021-08-27 DIAGNOSIS — R202 Paresthesia of skin: Secondary | ICD-10-CM

## 2021-08-27 LAB — POCT GLYCOSYLATED HEMOGLOBIN (HGB A1C): HbA1c, POC (controlled diabetic range): 6.5 % (ref 0.0–7.0)

## 2021-08-27 MED ORDER — FLUOXETINE HCL 20 MG PO TABS: 20.0000 mg | ORAL_TABLET | Freq: Every day | ORAL | 3 refills | Status: AC

## 2021-08-27 NOTE — Progress Notes (Signed)
     SUBJECTIVE:   CHIEF COMPLAINT / HPI:   Vickie Gonzales is a 35 y.o. female presents for left breast numbness  Breast concern Left breast numbness for a 1 month. Has no sensation over left breast. Noticed a cyst in inner part of left breast 3 months ago which resolved. Denies skin changes over breast, nipple discharge, increase in breast size. Denies previous surgeries. Fhx of breast cancer-mother.  Diabetes Patient's current diabetic medications. Diet controlled.  Patient endorses compliance with these medications. Does not check CBGs at homel. Patient's last A1c was  Lab Results  Component Value Date   HGBA1C 6.5 08/27/2021   HGBA1C 6.3 08/29/2020   HGBA1C 7.4 (A) 03/12/2020    Denies abdominal pain, blurred vision, polyuria, polydipsia, hypoglycemia. Patient states they understand that diet and exercise can help with her diabetes.  Last Microalbumin, LDL, Creatinine: Lab Results  Component Value Date   LDLCALC 119 (H) 08/29/2008   CREATININE 0.45 03/22/2021    Anxiety, Depression  Has been off Lexapro 5mg  since May and since then notices a decline in her mood. She cannot afford medications but would like to restart. She is interested in therapy. Denies SI.   Flowsheet Row Office Visit from 08/27/2021 in [facility]  PHQ-9 Total Score 16      PERTINENT  PMH / PSH: DM, HLD  OBJECTIVE:   BP 128/84   Pulse (!) 103   Ht 5\' 5"  (1.651 m)   LMP 08/13/2021   SpO2 99%   BMI 22.30 kg/m    General: Alert, no acute distress Cardio: well perfused  Pulm: normal work of breathing Neuro: Cranial nerves grossly intact, 5/5 strength upper and lower extremities, normal sensation throughout   Breast exam chaperoned by CMA Page No obvious abnormalities, no skin changes, deformities, nipple changes etc Numbness over left breast on palpation   ASSESSMENT/PLAN:   Numbness and tingling sensation of skin Numbness over left breast, unclear cause. No obvious  neurological cause. Could be a somatic symptom. Pt has anxiety and depression which may be contributing. Given strong FHx of breast cancer would like to rule out an organic cause. Ordered diagnostic mammogram. Precepted pt with Dr who recommended referring pt to specialist breast clinic at [facility].  Depression, recurrent (HCC) PHQ 9 16. No SI. Pt would like to start antidepressant today. Prozac will be the most affordable for the pt given her insurance. Sent in Prozac 20mg . Also recommend therapy. Provided therapy resources. Follow up in 2 weeks.   Diabetes (HCC) A1c 6.5, at goal. Congratulated pt. Continue diet controlled management of diabetes.    Manson Passey, MD PGY-3 [facility]

## 2021-08-27 NOTE — Patient Instructions (Signed)
Thank you for coming to see me today. It was a pleasure. Today we discussed your left breast pain. I recommend a mammogram. I am also referring you to [facility] Breast clinic.  For your mood please start prozac 20mg . Please come back in 2 weeks to see for mood follow up.    We will get some labs today.  If they are abnormal or we need to do something about them, I will call you.  If they are normal, I will send you a message on MyChart (if it is active) or a letter in the mail.  If you don't hear from [place] in 2 weeks, please call the office at the number below.   Please follow-up with me in 2 weeks.   If you have any questions or concerns, please do not hesitate to call the office at [phone].  Best wishes,   Dr Sherby Moncayo     [phone] National Suicide Hotline   (603) 194-0504 6-761-950-9326)   Therapy and Counseling Resources Most providers on this list will take Medicaid. Patients with commercial insurance or Medicare should contact their insurance company to get a list of in network providers.  [facility]:Psychiatry and Counseling [address] [place], [place] [phone]  [facility]  [address], Suite [place], [place]      [phone]  [facility] [address]  [place], [place] [phone]  [facility] [address], [address]  [place], [place]       [phone]     [facility] (virtual only) [phone]  [phone] [facility] [address] [place], [place]  [facility]:  [address] [place]  [facility]:  [address] [place] [phone]  [facility] (under & uninsured) [address]   [place]    [email or web address]    [facility] [phone] [address]    [phone]  [facility] [place] [facility] -[address]     Phone:  [phone]     [facility]-  [address]  [phone]   [facility] [address]      [place], [place] ext 1001  [facility]: [address] [place], [place], [place]   [facility] (Spanish therapist) [email or web address]/  [address] [place]  [address]   [place]    [phone]    [facility]   [address],  [place], [place]   [facility]  [address] [place]  [facility]  [address] [place], [place]        [phone]  Open Access/Walk In Clinic under & uninsured  [facility]  [address] [place], [place] Crisis [phone]  [facility] [place],  (Spanish)   [address], [place]: [phone]) 8:30 - 12; 1 - 2:30  [facility] of the ([phone],  [address], [place]    ([phone]):8:30 - 12; 2 - 3PM  [facility] [place],  [address],  [place]; [phone]):   Mon - Fri 8 AM - 5 PM  [facility] [address] [place]  [place]  12:30 to 3:00 or call to schedule an appointment  [phone]  Specific Provider options Psychology Today  https://www.psychologytoday.com/us click on find a therapist  enter your zip code left side and select or tailor a therapist for your specific need.   [facility] Provider Directory [email or web address]  (Medicaid)   Follow all drop down to find a provider  Social Support program [facility] [place] [phone] or PhotoSolver.pl [address], [place], [place] Recovery support and educational   24- Hour Availability:   [facility]  [address] [place], [place] [phone] Crisis [phone]  Family  Service of the [facility] [phone]  [place] [facility]  [phone]   [facility]  [phone] (after hours)  Therapeutic Alternative/Mobile Crisis   [phone]  [place] National Suicide Hotline  484-191-9003 [place])  Call 911 or go to emergency room  [facility]  [phone]);  [place] and [employer]  [phone]); [place], [place], [place], [place], [place], [place], [place]

## 2021-08-31 DIAGNOSIS — F339 Major depressive disorder, recurrent, unspecified: Secondary | ICD-10-CM | POA: Insufficient documentation

## 2021-08-31 NOTE — Assessment & Plan Note (Signed)
PHQ 9 16. No SI. Pt would like to start antidepressant today. Prozac will be the most affordable for the pt given her insurance. Sent in Prozac 20mg . Also recommend therapy. Provided therapy resources. Follow up in 2 weeks.

## 2021-08-31 NOTE — Assessment & Plan Note (Signed)
Numbness over left breast, unclear cause. No obvious neurological cause. Could be a somatic symptom. Pt has anxiety and depression which may be contributing. Given strong FHx of breast cancer would like to rule out an organic cause. Ordered diagnostic mammogram. Precepted pt with Dr Manson Passey who recommended referring pt to specialist breast clinic at [facility].

## 2021-08-31 NOTE — Assessment & Plan Note (Signed)
A1c 6.5, at goal. Congratulated pt. Continue diet controlled management of diabetes.

## 2021-09-04 ENCOUNTER — Telehealth: Payer: Self-pay | Admitting: Family Medicine

## 2021-09-04 ENCOUNTER — Other Ambulatory Visit: Payer: Self-pay

## 2021-09-04 ENCOUNTER — Telehealth (INDEPENDENT_AMBULATORY_CARE_PROVIDER_SITE_OTHER): Payer: Self-pay | Admitting: Family Medicine

## 2021-09-04 DIAGNOSIS — Z91199 Patient's noncompliance with other medical treatment and regimen due to unspecified reason: Secondary | ICD-10-CM

## 2021-09-04 NOTE — Progress Notes (Signed)
No show

## 2021-09-04 NOTE — Telephone Encounter (Signed)
Left HIPAA complaint VM regarding telemedicine visit this morning.

## 2021-09-10 ENCOUNTER — Telehealth: Payer: Medicaid Other | Admitting: Family Medicine

## 2022-02-25 NOTE — Progress Notes (Signed)
? ? ?  SUBJECTIVE:  ? ?CHIEF COMPLAINT / HPI: amenorrhea  ? ?Sexual activity, yes  ?LMP was in February 8-9  ?Current medications include no forms of contraception, patient would like to restart contraception if test is negative ?If pregnant, she wishes to terminate pregnancy due to hx of being high risk and reports issues with placental abruption   ?Abdominal pain: reports some cramping   ?Was previously on contraception but had to stop due to loss of insurance  ?She reports nausea, feeling lightheaded, breast tenderness and increased appetite in the last few weeks  ? ? ?Elevated BP  ?Patient states she does not take BP medication. She states that she is nervous about potential pregnancy. She denies chest pain, blurry vision, dyspnea or HA at this time.  ? ?PERTINENT  PMH / PSH:  ?DM ?Anxiety  ? ? ?OBJECTIVE:  ? ?BP (!) 150/88 (BP Location: Right Arm)   Pulse (!) 101   Wt 134 lb (60.8 kg)   LMP 01/08/2022 (Approximate)   BMI 22.30 kg/m?   ?Physical Exam ?Constitutional:   ?   General: She is not in acute distress. ?   Appearance: She is normal weight.  ?   Comments: Anxious appearing   ?Eyes:  ?   Conjunctiva/sclera: Conjunctivae normal.  ?Cardiovascular:  ?   Rate and Rhythm: Normal rate and regular rhythm.  ?Pulmonary:  ?   Effort: Pulmonary effort is normal.  ?Abdominal:  ?   General: Bowel sounds are normal.  ?   Palpations: Abdomen is soft.  ?Musculoskeletal:  ?   Cervical back: Normal range of motion.  ?Skin: ?   General: Skin is warm.  ?Neurological:  ?   General: No focal deficit present.  ?   Mental Status: She is alert and oriented to person, place, and time.  ?Psychiatric:  ?   Comments: Mildly anxious appearing  ?Denies SI  ?  ? ? ?ASSESSMENT/PLAN:  ? ?Amenorrhea ?UPT   ? ? ?Elevated blood-pressure reading without diagnosis of hypertension ?Will need recheck at follow up appointment  ?Discussed medication recommendations depending on her decision to continue or not with the pregnancy  ? ?Positive  pregnancy test ?Patient would like to follow up in 7-10 days to think and consider if she would like to continue with pregnancy vs termination  ?Resources were given to patient  ?Discussed if she desires to continue, we would collect blood work and likely refer to [facility] given her hx  ? ?  ?Ronnald Ramp, MD ?[facility]  ?

## 2022-02-26 ENCOUNTER — Ambulatory Visit (INDEPENDENT_AMBULATORY_CARE_PROVIDER_SITE_OTHER): Payer: Self-pay | Admitting: Family Medicine

## 2022-02-26 VITALS — BP 150/88 | HR 101 | Wt 134.0 lb

## 2022-02-26 DIAGNOSIS — R030 Elevated blood-pressure reading, without diagnosis of hypertension: Secondary | ICD-10-CM

## 2022-02-26 DIAGNOSIS — N912 Amenorrhea, unspecified: Secondary | ICD-10-CM | POA: Insufficient documentation

## 2022-02-26 DIAGNOSIS — N926 Irregular menstruation, unspecified: Secondary | ICD-10-CM

## 2022-02-26 DIAGNOSIS — Z3201 Encounter for pregnancy test, result positive: Secondary | ICD-10-CM | POA: Insufficient documentation

## 2022-02-26 DIAGNOSIS — I10 Essential (primary) hypertension: Secondary | ICD-10-CM | POA: Insufficient documentation

## 2022-02-26 LAB — POCT URINE PREGNANCY: Preg Test, Ur: POSITIVE — AB

## 2022-02-26 NOTE — Patient Instructions (Signed)
Your pregnancy test today was positive.  ? ?I have provided contact information for you if you would like to move forward as we discussed.  ? ?[phone] ?

## 2022-02-26 NOTE — Assessment & Plan Note (Addendum)
+  UPT 

## 2022-03-01 NOTE — Assessment & Plan Note (Signed)
Patient would like to follow up in 7-10 days to think and consider if she would like to continue with pregnancy vs termination  ?Resources were given to patient  ?Discussed if she desires to continue, we would collect blood work and likely refer to [facility] given her hx  ?

## 2022-03-01 NOTE — Assessment & Plan Note (Signed)
Will need recheck at follow up appointment  ?Discussed medication recommendations depending on her decision to continue or not with the pregnancy  ?

## 2022-03-10 ENCOUNTER — Ambulatory Visit (INDEPENDENT_AMBULATORY_CARE_PROVIDER_SITE_OTHER): Payer: Self-pay | Admitting: Family Medicine

## 2022-03-10 VITALS — BP 159/85 | HR 99 | Ht 65.0 in | Wt 155.5 lb

## 2022-03-10 DIAGNOSIS — S335XXA Sprain of ligaments of lumbar spine, initial encounter: Secondary | ICD-10-CM

## 2022-03-10 NOTE — Patient Instructions (Addendum)
It was nice seeing you today! ? ?Take Tylenol as needed for back pain. ? ?Try back exercises once your pain is improved and you can tolerate exercises. ? ?Make sure to follow-up with your primary doctor for your other medical issues. ? ?Stay well, ?Zola Button, MD ?[facility] ?([phone] ? ?-- ? ?Make sure to check out at the front desk before you leave today. ? ?Please arrive at least 15 minutes prior to your scheduled appointments. ? ?If you had blood work today, I will send you a MyChart message or a letter if results are normal. Otherwise, I will give you a call. ? ?If you had a referral placed, they will call you to set up an appointment. Please give [place] a call if you don't hear back in the next 2 weeks. ? ?If you need additional refills before your next appointment, please call your pharmacy first.  ?

## 2022-03-10 NOTE — Progress Notes (Signed)
? ? ?  SUBJECTIVE:  ? ?CHIEF COMPLAINT / HPI:  ?Chief Complaint  ?Patient presents with  ? Spasms  ?  ?Patient reports acute onset of lower left-sided back pain which does radiate down the left leg secondary to an injury that occurred at work yesterday.  Patient states that she was lifting a heavy crate when she felt that she pulled something in her back.  She believes someone had inappropriately overfilled the great.  She has not taking any medications for this.  Denies leg weakness, saddle anesthesia, bowel/bladder incontinence.  She does report a history of back pain in the past. ? ?PERTINENT  PMH / PSH: Recent positive pregnancy test ? ?Patient Care Team: ?Towanda Octave, MD as PCP - General  ? ?OBJECTIVE:  ? ?BP (!) 159/85   Pulse 99   Ht 5\' 5"  (1.651 m)   Wt 155 lb 8 oz (70.5 kg)   LMP 01/08/2022   SpO2 99%   BMI 25.88 kg/m?   ?Physical Exam ?Constitutional:   ?   General: She is not in acute distress. ?   Appearance: She is not ill-appearing.  ?Cardiovascular:  ?   Rate and Rhythm: Normal rate and regular rhythm.  ?Pulmonary:  ?   Effort: Pulmonary effort is normal. No respiratory distress.  ?   Breath sounds: Normal breath sounds.  ?Musculoskeletal:     ?   General: No tenderness or deformity.  ?   Cervical back: Neck supple.  ?   Comments: Back with no obvious deformity.  No significant tenderness with palpation midline and paraspinal muscles.  She has significant pain with flexion of the spine with moderate pain with extension and lateral flexion.  Straight leg raise is negative on the right, difficult to assess on the left due to pain in the back with minimal flexion.  ?Neurological:  ?   Mental Status: She is alert.  ?   Motor: No weakness.  ?   Deep Tendon Reflexes:  ?   Reflex Scores: ?     Patellar reflexes are 2+ on the right side and 2+ on the left side. ?     Achilles reflexes are 2+ on the right side and 2+ on the left side. ?   Comments: Gait appears somewhat antalgic.  Full strength in her  bilateral lower extremities.  ?  ? ? ?  03/10/2022  ?  9:27 AM  ?Depression screen PHQ 2/9  ?Decreased Interest 1  ?Down, Depressed, Hopeless 1  ?PHQ - 2 Score 2  ?Altered sleeping 1  ?Tired, decreased energy 1  ?Change in appetite 1  ?Feeling bad or failure about yourself  1  ?Trouble concentrating 1  ?Moving slowly or fidgety/restless 0  ?Suicidal thoughts 0  ?PHQ-9 Score 7  ?  ? ?{Show previous vital signs (optional):23777} ? ? ? ?ASSESSMENT/PLAN:  ? ?Low back strain ?Lower back pain since injury yesterday at work, suspect muscular injury.  No red flag signs or symptoms.  As she is currently pregnant, advised her to take acetaminophen as needed for pain only.  Back exercises provided to start when her pain improves.  Follow-up if not improving, can consider x-ray imaging and PT referral. ? ?Return if symptoms worsen or fail to improve.  ? ?4/9, MD ?[facility]  ?

## 2022-04-29 ENCOUNTER — Ambulatory Visit (INDEPENDENT_AMBULATORY_CARE_PROVIDER_SITE_OTHER): Payer: Self-pay | Admitting: Family Medicine

## 2022-04-29 ENCOUNTER — Encounter: Payer: Self-pay | Admitting: Family Medicine

## 2022-04-29 VITALS — BP 174/101 | HR 112 | Ht 65.0 in | Wt 163.8 lb

## 2022-04-29 DIAGNOSIS — O039 Complete or unspecified spontaneous abortion without complication: Secondary | ICD-10-CM

## 2022-04-29 DIAGNOSIS — R000 Tachycardia, unspecified: Secondary | ICD-10-CM

## 2022-04-29 DIAGNOSIS — IMO0001 Reserved for inherently not codable concepts without codable children: Secondary | ICD-10-CM

## 2022-04-29 DIAGNOSIS — Z3201 Encounter for pregnancy test, result positive: Secondary | ICD-10-CM

## 2022-04-29 DIAGNOSIS — I10 Essential (primary) hypertension: Secondary | ICD-10-CM

## 2022-04-29 DIAGNOSIS — E119 Type 2 diabetes mellitus without complications: Secondary | ICD-10-CM

## 2022-04-29 DIAGNOSIS — R030 Elevated blood-pressure reading, without diagnosis of hypertension: Secondary | ICD-10-CM

## 2022-04-29 LAB — POCT GLYCOSYLATED HEMOGLOBIN (HGB A1C): HbA1c, POC (controlled diabetic range): 5.8 % (ref 0.0–7.0)

## 2022-04-29 MED ORDER — LABETALOL HCL 100 MG PO TABS: 100.0000 mg | ORAL_TABLET | Freq: Two times a day (BID) | ORAL | 0 refills | Status: DC

## 2022-04-29 NOTE — Progress Notes (Incomplete)
     SUBJECTIVE:   CHIEF COMPLAINT / HPI:   JENAYE GALLIHER is a 36 y.o. female presents for blood pressure concern  Elevated blood pressure in setting of pregnancy  Patient's last LMP Feb 9th, and is currently [redacted] weeks pregnant.  Prior to this patient was on OCPs over a year ago.  The pregnancy was unplanned and she is awaiting abortion at the abortion clinic.  She has gone several times that type the abortion however her blood pressures are elevated and they recommended her to have a blood pressure agent before having the abortion.  Denies headaches, vision changes, vomiting, abdominal pain etc. Does have a hx of pre-eclampsia with her other 2 pregnancies. Does not check Bps at home.  Denies abdominal cramping, vaginal bleeding, fluid leakage etc. BP 159/85 at last visit on 03/10/22   Diabetes Diet controlled.  Patient's last A1c was  Lab Results  Component Value Date   HGBA1C 5.8 04/29/2022   HGBA1C 6.5 08/27/2021   HGBA1C 6.3 08/29/2020   Denies abdominal pain, blurred vision, polyuria, polydipsia, hypoglycemia  Patient states they understand that diet and exercise can help with her diabetes.    Last Microalbumin, LDL, Creatinine: Lab Results  Component Value Date   LDLCALC 113 (H) 04/29/2022   CREATININE 0.52 (L) 04/29/2022    [place] Office Visit from 04/29/2022 in [place]  PHQ-9 Total Score 54        PERTINENT  PMH / PSH: diabetes, HLD   OBJECTIVE:   BP (!) 174/101   Pulse (!) 112   Ht 5\' 5"  (1.651 m)   Wt 163 lb 12.8 oz (74.3 kg)   LMP 01/08/2022   SpO2 100%   Breastfeeding Unknown   BMI 27.26 kg/m    General: Alert, no acute distress Cardio: Well-perfused Pulm: normal work of breathing Neuro: Cranial nerves grossly intact   ASSESSMENT/PLAN:   Elevated blood-pressure reading without diagnosis of hypertension Blood pressure significantly elevated to 175/106.  Patient is asymptomatic which is reassuring.  Given that patient  is less than [redacted] weeks pregnant this is not gestational hypertension but likely pre-existing HTN.  Patient is awaiting an abortion on 05/04/2022.  Started labetalol 100 mg twice daily today.  Obtained CMP, CBC today. Follow-up with me in 2 days for blood pressure recheck.  Strict ER and MAU precautions given to patient.  Positive pregnancy test Strongly recommended patient starting birth control after her abortion.  She will discuss this at the abortion clinic and restart OCPs.  Diabetes ([place]) A1c 5.8, at goal.  Congratulated patient.  Follow-up in 3 months.  Tachycardia Patient tachycardic today, on recheck 2.  Likely in the setting of anxiety and pregnancy however will obtain TSH today to rule out thyroid disorder.    Lattie Haw, MD PGY-3 [place]

## 2022-04-29 NOTE — Patient Instructions (Signed)
Thank you for coming to see me today. It was a pleasure. Today we discussed your blood pressure it is very high. I recommend labetalol 100mg  twice a day   We will get some labs today.  If they are abnormal or we need to do something about them, I will call you.  If they are normal, I will send you a message on MyChart (if it is active) or a letter in the mail.  If you don't hear from in 2 weeks, please call the office at the number below.    Go to the MAU at [facility] if: You have cramping/contractions that do not go away with drinking water You have vaginal bleeding.      Please follow-up with me in 2 days   If you have any questions or concerns, please do not hesitate to call the office at [phone].  Best wishes,   Dr [phone]

## 2022-04-30 LAB — COMPREHENSIVE METABOLIC PANEL
ALT: 12 IU/L (ref 0–32)
AST: 13 IU/L (ref 0–40)
Albumin/Globulin Ratio: 1.5 (ref 1.2–2.2)
Albumin: 3.4 g/dL — ABNORMAL LOW (ref 3.8–4.8)
Alkaline Phosphatase: 58 IU/L (ref 44–121)
BUN/Creatinine Ratio: 29 — ABNORMAL HIGH (ref 9–23)
BUN: 15 mg/dL (ref 6–20)
Bilirubin Total: 0.3 mg/dL (ref 0.0–1.2)
CO2: 21 mmol/L (ref 20–29)
Calcium: 8.4 mg/dL — ABNORMAL LOW (ref 8.7–10.2)
Chloride: 106 mmol/L (ref 96–106)
Creatinine, Ser: 0.52 mg/dL — ABNORMAL LOW (ref 0.57–1.00)
Globulin, Total: 2.3 g/dL (ref 1.5–4.5)
Glucose: 93 mg/dL (ref 70–99)
Potassium: 3.5 mmol/L (ref 3.5–5.2)
Sodium: 140 mmol/L (ref 134–144)
Total Protein: 5.7 g/dL — ABNORMAL LOW (ref 6.0–8.5)
eGFR: 126 mL/min/{1.73_m2} (ref >59)

## 2022-04-30 LAB — LIPID PANEL
Chol/HDL Ratio: 2.8 ratio (ref 0.0–4.4)
Cholesterol, Total: 195 mg/dL (ref 100–199)
HDL: 70 mg/dL (ref >39)
LDL Chol Calc (NIH): 113 mg/dL — ABNORMAL HIGH (ref 0–99)
Triglycerides: 67 mg/dL (ref 0–149)
VLDL Cholesterol Cal: 12 mg/dL (ref 5–40)

## 2022-04-30 LAB — TSH: TSH: 2.41 u[IU]/mL (ref 0.450–4.500)

## 2022-05-01 ENCOUNTER — Ambulatory Visit: Payer: Medicaid Other

## 2022-05-01 DIAGNOSIS — R000 Tachycardia, unspecified: Secondary | ICD-10-CM | POA: Insufficient documentation

## 2022-05-01 NOTE — Assessment & Plan Note (Signed)
Patient tachycardic today, on recheck 2.  Likely in the setting of anxiety and pregnancy however will obtain TSH today to rule out thyroid disorder.

## 2022-05-01 NOTE — Assessment & Plan Note (Signed)
Blood pressure significantly elevated to 175/106.  Patient is asymptomatic which is reassuring.  Given that patient is less than [redacted] weeks pregnant this is not gestational hypertension but likely pre-existing HTN.  Patient is awaiting an abortion on 05/04/2022.  Started labetalol 100 mg twice daily today.  Obtained CMP, CBC today. Follow-up with me in 2 days for blood pressure recheck.  Strict ER and MAU precautions given to patient.

## 2022-05-01 NOTE — Assessment & Plan Note (Addendum)
Strongly recommended patient starting birth control after her abortion.  She will discuss this at the abortion clinic and restart OCPs.

## 2022-05-01 NOTE — Assessment & Plan Note (Addendum)
A1c 5.8, at goal.  Congratulated patient.  Follow-up in 3 months.

## 2023-08-08 ENCOUNTER — Emergency Department (HOSPITAL_COMMUNITY)
Admission: EM | Admit: 2023-08-08 | Discharge: 2023-08-08 | Disposition: A | Discharge status: Home / Self Care | Payer: Medicaid Other | Attending: Emergency Medicine | Admitting: Emergency Medicine

## 2023-08-08 ENCOUNTER — Encounter (HOSPITAL_COMMUNITY): Payer: Self-pay

## 2023-08-08 ENCOUNTER — Other Ambulatory Visit: Payer: Self-pay

## 2023-08-08 DIAGNOSIS — K0889 Other specified disorders of teeth and supporting structures: Secondary | ICD-10-CM | POA: Diagnosis not present

## 2023-08-08 DIAGNOSIS — I10 Essential (primary) hypertension: Secondary | ICD-10-CM | POA: Diagnosis not present

## 2023-08-08 DIAGNOSIS — Z79899 Other long term (current) drug therapy: Secondary | ICD-10-CM | POA: Diagnosis not present

## 2023-08-08 MED ORDER — IBUPROFEN 800 MG PO TABS: 800.0000 mg | ORAL_TABLET | Freq: Three times a day (TID) | ORAL | 0 refills | Status: AC | PRN

## 2023-08-08 MED ORDER — LABETALOL HCL 100 MG PO TABS: 100.0000 mg | ORAL_TABLET | Freq: Two times a day (BID) | ORAL | 0 refills | Status: DC

## 2023-08-08 MED ORDER — CLINDAMYCIN HCL 300 MG PO CAPS: 300.0000 mg | ORAL_CAPSULE | Freq: Three times a day (TID) | ORAL | 0 refills | Status: AC

## 2023-08-08 NOTE — ED Triage Notes (Addendum)
Dental pain x two days. Hypertensive in triage, asymptomatic. States use to take medicine for BP and stopped. Took Otc medication for dental pain, no relief.

## 2023-08-08 NOTE — Discharge Instructions (Addendum)
Return if any problems.  Schedule to see the dentist for evaluation.  See your Physician for recheck of your blood pressure

## 2023-08-09 NOTE — ED Provider Notes (Signed)
[place] EMERGENCY DEPARTMENT AT [facility] Provider Note   CSN: [id number] Arrival date & time: 08/08/23  1708     History  No chief complaint on file.   ROSHELL Gonzales is a 37 y.o. female.  Patient complains of a toothache.  She reports that she had swelling to the side of her mouth.  Patient reports that she has been gargling with peroxide.  Patient reports area opened and drained.  Denies any fever or chills she denies any difficulty breathing.  She does not have any swelling to her throat  The history is provided by the patient. No language interpreter was used.       Home Medications Prior to Admission medications   Medication Sig Start Date End Date Taking? Authorizing Provider  clindamycin (CLEOCIN) 300 MG capsule Take 1 capsule (300 mg total) by mouth 3 (three) times daily for 10 days. 08/08/23 08/18/23 Yes Elson Areas, PA-C  ibuprofen (ADVIL) 800 MG tablet Take 1 tablet (800 mg total) by mouth every 8 (eight) hours as needed. 08/08/23  Yes Cheron Schaumann K, PA-C  blood glucose meter kit and supplies KIT Dispense based on patient and insurance preference. Use up to four times daily as directed. (FOR ICD-9 250.00, 250.01). 02/11/17   Hedges, Tinnie Gens, PA-C  escitalopram (LEXAPRO) 5 MG tablet Take 2 tablets by mouth once daily 04/24/21   Autry-Lott, Randa Evens, DO  FLUoxetine (PROZAC) 20 MG tablet Take 1 tablet (20 mg total) by mouth daily. 08/27/21   Cathleen Corti, MD  gabapentin (NEURONTIN) 100 MG capsule Take 1 capsule (100 mg total) by mouth 3 (three) times daily. 04/25/20   Myrene Buddy, MD  labetalol (NORMODYNE) 100 MG tablet Take 1 tablet (100 mg total) by mouth 2 (two) times daily for 28 days. 08/08/23 09/05/23  Elson Areas, PA-C  Multiple Vitamin (MULTIVITAMIN WITH MINERALS) TABS tablet Take 1 tablet by mouth daily.    [provider]  metFORMIN (GLUCOPHAGE-XR) 500 MG 24 hr tablet Take 1 tablet (500 mg total) by mouth daily with  breakfast. Patient not taking: Reported on 05/27/2019 11/02/18 11/09/20  Leland Her, DO      Allergies    Patient has no known allergies.    Review of Systems   Review of Systems  HENT:  Positive for dental problem.   All other systems reviewed and are negative.   Physical Exam Updated Vital Signs BP (!) 195/118   Pulse (!) 124   Ht 5\' 5"  (1.651 m)   Wt 74.3 kg   LMP 08/01/2023   SpO2 100%   BMI 27.26 kg/m  Physical Exam Vitals and nursing note reviewed.  Constitutional:      Appearance: She is well-developed.  HENT:     Head: Normocephalic.     Nose:     Comments: Dental decay, swelling upper left gumline Cardiovascular:     Rate and Rhythm: Normal rate.  Pulmonary:     Effort: Pulmonary effort is normal.  Musculoskeletal:        General: Normal range of motion.  Skin:    General: Skin is warm.  Neurological:     General: No focal deficit present.     Mental Status: She is alert and oriented to person, place, and time.     ED Results / Procedures / Treatments   Labs (all labs ordered are listed, but only abnormal results are displayed) Labs Reviewed - No data to display  EKG None  Radiology  No results found.  Procedures Procedures    Medications Ordered in ED Medications - No data to display  ED Course/ Medical Decision Making/ A&P                                 Medical Decision Making Patient complains of a toothache and having an abscess that started draining.  Risk Prescription drug management. Risk Details: Patient does not have a local dentist.  Patient is given a prescription for clindamycin and ibuprofen.  She is given the phone number for dentistry to follow-up with.  Patient has been hypertensive while in the emergency department she states she is not currently taking labetalol.  I gave patient a prescription for labetalol she is advised to see her primary care physician for recheck and blood pressure  management.           Final Clinical Impression(s) / ED Diagnoses Final diagnoses:  Toothache  Hypertension, unspecified type    Rx / DC Orders ED Discharge Orders          Ordered    labetalol (NORMODYNE) 100 MG tablet  2 times daily        08/08/23 1738    clindamycin (CLEOCIN) 300 MG capsule  3 times daily        08/08/23 1738    ibuprofen (ADVIL) 800 MG tablet  Every 8 hours PRN        08/08/23 1738              Osie Cheeks 08/09/23 1631    Lorre Nick, MD 08/10/23 1258

## 2023-12-20 ENCOUNTER — Telehealth: Payer: Self-pay

## 2023-12-20 NOTE — Telephone Encounter (Signed)
Patient calls nurse line reporting flu like symptoms.   She reports fatigue, shortness of breath and decreased PO intake.   She reports she is diabetic and has had "very little" to eat or drink in the last 48 hours. She reports she attempted to shower yesterday and reports "blacking out and waking up on the bathroom floor."   She denies any head injury. She reports continued fatigued and decreased PO intake this morning. She reports she has been only sipping water.   Patient advised to go to the emergency department for further evaluation.   Patient agreed with plan.

## 2023-12-24 ENCOUNTER — Ambulatory Visit (INDEPENDENT_AMBULATORY_CARE_PROVIDER_SITE_OTHER): Payer: Medicaid Other | Admitting: Family Medicine

## 2023-12-24 ENCOUNTER — Telehealth: Payer: Self-pay

## 2023-12-24 VITALS — BP 150/107 | HR 109 | Ht 65.0 in | Wt 152.4 lb

## 2023-12-24 DIAGNOSIS — I10 Essential (primary) hypertension: Secondary | ICD-10-CM

## 2023-12-24 DIAGNOSIS — J069 Acute upper respiratory infection, unspecified: Secondary | ICD-10-CM

## 2023-12-24 DIAGNOSIS — Z0000 Encounter for general adult medical examination without abnormal findings: Secondary | ICD-10-CM

## 2023-12-24 DIAGNOSIS — F17200 Nicotine dependence, unspecified, uncomplicated: Secondary | ICD-10-CM

## 2023-12-24 DIAGNOSIS — E119 Type 2 diabetes mellitus without complications: Secondary | ICD-10-CM

## 2023-12-24 DIAGNOSIS — J310 Chronic rhinitis: Secondary | ICD-10-CM | POA: Diagnosis not present

## 2023-12-24 DIAGNOSIS — Z7985 Long-term (current) use of injectable non-insulin antidiabetic drugs: Secondary | ICD-10-CM

## 2023-12-24 DIAGNOSIS — R062 Wheezing: Secondary | ICD-10-CM

## 2023-12-24 LAB — POCT GLYCOSYLATED HEMOGLOBIN (HGB A1C): HbA1c, POC (controlled diabetic range): 7.8 % — AB (ref 0.0–7.0)

## 2023-12-24 LAB — POC SOFIA 2 FLU + SARS ANTIGEN FIA
Influenza A, POC: NEGATIVE
Influenza B, POC: NEGATIVE
SARS Coronavirus 2 Ag: NEGATIVE

## 2023-12-24 MED ORDER — FLUTICASONE PROPIONATE 50 MCG/ACT NA SUSP: 2.0000 | Freq: Every day | NASAL | 6 refills | Status: AC

## 2023-12-24 MED ORDER — LABETALOL HCL 100 MG PO TABS: 100.0000 mg | ORAL_TABLET | Freq: Two times a day (BID) | ORAL | 2 refills | Status: DC

## 2023-12-24 MED ORDER — SEMAGLUTIDE(0.25 OR 0.5MG/DOS) 2 MG/3ML ~~LOC~~ SOPN: PEN_INJECTOR | SUBCUTANEOUS | 3 refills | Status: AC

## 2023-12-24 MED ORDER — ALBUTEROL SULFATE HFA 108 (90 BASE) MCG/ACT IN AERS: 2.0000 | INHALATION_SPRAY | Freq: Four times a day (QID) | RESPIRATORY_TRACT | 2 refills | Status: AC | PRN

## 2023-12-24 MED ORDER — GUAIFENESIN ER 600 MG PO TB12: 600.0000 mg | ORAL_TABLET | Freq: Two times a day (BID) | ORAL | 0 refills | Status: AC

## 2023-12-24 NOTE — Progress Notes (Signed)
    SUBJECTIVE:   CHIEF COMPLAINT / HPI:   COVID exposure Exposed on 1/15 to COVID. Started feeling sick since 1/17, some congestion, chest tightness and feels like "head cold".. Lightheadness on 1/19 but none since. Symptoms have improved this week.  Current smoker but has been smoking less due to feeling ill. Mostly have been staying home and drink a lot of water.  Diabetes Current Regimen: Not currently on therapy, did not like Metformin due to diarrhea side effects CBGs: Not checking  Last A1c:  Lab Results  Component Value Date   HGBA1C 7.8 (A) 12/24/2023    Denies polyuria, polydipsia, hypoglycemia. Last Eye Exam: DUE - need discussion at follow up Statin: None - under 40 ACE/ARB: None  Hypertension: - Medications: Labetalol 100mg  BID (ran out in 08/2023, has not taken anything since that time) - Checking BP at home: No - Denies any SOB, CP, vision changes, LE edema, medication SEs, or symptoms of hypotension  PERTINENT  PMH / PSH: HTN, T2DM, MDD  OBJECTIVE:   BP (!) 150/107   Pulse (!) 109   Ht 5\' 5"  (1.651 m)   Wt 152 lb 6.4 oz (69.1 kg)   SpO2 99%   BMI 25.36 kg/m    General: NAD, pleasant, able to participate in exam HEENT: TM clear bilaterally. Moist mucus membranes. No tonsillary erythema or exudates. Erythema and enlarged nasal turbinates bilaterally. Two small, non-tender cervical lymph nodes palpated. Cardiac: RRR, no murmurs. Respiratory: Mild expiratory wheezing throughout. Normal WOB on RA. Extremities: no edema or cyanosis. Skin: warm and dry, no rashes noted Neuro: alert, no obvious focal deficits Psych: Normal affect and mood  ASSESSMENT/PLAN:   Assessment & Plan Type 2 diabetes mellitus without complication, without long-term current use of insulin (HCC) A1c 7.8, increase from 5.8 in 03/2022. Not currently on medication management, persistent diarrhea with Metformin so would like alternative. -Start Ozempic 0.25mg  x 4 weeks and titrate up to  0.5mg  weekly -Needs BMP, ACR, discussed about ophthalmology exam, foot exam at follow up with PCP Primary hypertension 150/107 upon repeat, patient with strong preference to continue on Labetalol, will refill to day but will likely need additional therapy. Does not desire future pregnancy, can consider wider variety of agents (may need contraception discussion). -Refill Labetalol 100mg  BID Upper respiratory tract infection, unspecified type Negative for COVID and flu, likely other viral URI. Mild expiratory wheezing upon exam but no know history of asthma, could be secondary to illness vs underlying lung disease. -Albuterol 1-2 puffs q4-6h prn wheezing/SOB. Advised on proper use of inhaler -Mucinex BID and Flonase + nasal saline for symptomatic care -Consider PFTs when recovered to assess for asthma TOBACCO USER Current smoker, would benefit from further discussion about smoking cessation at follow up given above wheezing concern Healthcare maintenance Many care gaps due to lack of follow up, discussed importance of scheduling with PCP for ongoing care.     Dr. Elberta Fortis, DO [facility]

## 2023-12-24 NOTE — Telephone Encounter (Signed)
Pharmacy Patient Advocate Encounter  Received notification from [facility] that Prior Authorization for [facility] [place] has been APPROVED from 12/24/23 to 12/23/24.

## 2023-12-24 NOTE — Patient Instructions (Signed)
It was wonderful to see you today! Thank you for choosing [facility].   Please bring ALL of your medications with you to every visit.   Today we talked about:  Your A1c is 7.8 today, we need to restart medication to better control your diabetes.  Please take the Ozempic injection 1 time per week.  You will start with 0.25 mg dose for 4 weeks and then increase to the 0.5 mg dose thereafter.  Will recheck your A1c in 3 months.  If your A1c is not coming down we may consider starting to check your fasting blood sugar daily. Your blood pressure is elevated today, we will restart the labetalol and see how it does at follow-up.  Please check your blood pressure at home if you can with a goal of under 130/80. I do note that you are wheezing today and have mucus in your chest I think using the albuterol inhaler 1 to 2 puffs as needed for symptom relief could help.  Please also take the Mucinex twice per day with lots of water to help clear the mucus.  For your nose running please also use the Flonase 1 to 2 sprays in each nostril every day will help with the inflammation and hopefully increase drainage.  Please follow-up with your PCP to continue discussion of ongoing health maintenance things.  Please follow up in 3-4 months  If you haven't already, sign up for My Chart to have easy access to your labs results, and communication with your primary care physician.  Call the clinic at [phone] if your symptoms worsen or you have any concerns.  Please be sure to schedule follow up at the front desk before you leave today.   Elberta Fortis, DO Family Medicine

## 2023-12-24 NOTE — Assessment & Plan Note (Signed)
Current smoker, would benefit from further discussion about smoking cessation at follow up given above wheezing concern

## 2023-12-24 NOTE — Assessment & Plan Note (Signed)
150/107 upon repeat, patient with strong preference to continue on Labetalol, will refill to day but will likely need additional therapy. Does not desire future pregnancy, can consider wider variety of agents (may need contraception discussion). -Refill Labetalol 100mg  BID

## 2023-12-24 NOTE — Telephone Encounter (Signed)
Pharmacy Patient Advocate Encounter   Received notification from CoverMyMeds that prior authorization for OZEMPIC 0.25/0.5MG  is required/requested.   The patient is insured through [facility] .   PA required; PA submitted to above mentioned insurance via CoverMyMeds Key/confirmation #/EOC [id number]. Status is pending

## 2023-12-24 NOTE — Assessment & Plan Note (Signed)
Many care gaps due to lack of follow up, discussed importance of scheduling with PCP for ongoing care.

## 2023-12-24 NOTE — Assessment & Plan Note (Signed)
A1c 7.8, increase from 5.8 in 03/2022. Not currently on medication management, persistent diarrhea with Metformin so would like alternative. -Start Ozempic 0.25mg  x 4 weeks and titrate up to 0.5mg  weekly -Needs BMP, ACR, discussed about ophthalmology exam, foot exam at follow up with PCP

## 2024-01-20 ENCOUNTER — Telehealth: Payer: Self-pay

## 2024-01-20 ENCOUNTER — Ambulatory Visit: Payer: Medicaid Other | Admitting: Family Medicine

## 2024-01-20 NOTE — Telephone Encounter (Signed)
 Attempted to call patient to reschedule appt due to weather. No answer. Unable to LVM due to mailbox being full . Aquilla Solian, CMA

## 2024-02-22 ENCOUNTER — Ambulatory Visit: Admitting: Family Medicine

## 2024-02-22 VITALS — BP 165/104 | HR 121 | Wt 159.6 lb

## 2024-02-22 DIAGNOSIS — Z3201 Encounter for pregnancy test, result positive: Secondary | ICD-10-CM | POA: Diagnosis not present

## 2024-02-22 DIAGNOSIS — R112 Nausea with vomiting, unspecified: Secondary | ICD-10-CM | POA: Diagnosis not present

## 2024-02-22 DIAGNOSIS — I10 Essential (primary) hypertension: Secondary | ICD-10-CM | POA: Diagnosis not present

## 2024-02-22 LAB — POCT URINE PREGNANCY: Preg Test, Ur: POSITIVE — AB

## 2024-02-22 MED ORDER — LABETALOL HCL 300 MG PO TABS: 300.0000 mg | ORAL_TABLET | Freq: Two times a day (BID) | ORAL | 3 refills | Status: DC

## 2024-02-22 MED ORDER — DOXYLAMINE-PYRIDOXINE 10-10 MG PO TBEC: 2.0000 | DELAYED_RELEASE_TABLET | Freq: Every evening | ORAL | 1 refills | Status: AC

## 2024-02-22 NOTE — Patient Instructions (Addendum)
 1) Your blood pressure and heart rate are both high. This can cause damage to your heart and blood vessels if we leave it too long. - I will increase your labetalol to 300mg  twice a day - Come back later this week or early next week to get your vitals checked again  2) For your nausea, take diclegis 2 tablets every night - Make sure you drink plenty of water to stay hydrated   3) For termination options, please reach out to:  [facility] [place] [place] [email or web address] Phone: [phone] [address] [place], [place] [address]  Your first choice for confidential and quality abortion care in [place], [place] and surrounding areas. At [facility] we are woman-owned and operated and pride ourselves on putting you first! All of our staff are non-judgmental and will provide compassionate support and quality care.  Abortion Pill through 11 weeks Abortion Care through 12 weeks, completed in-clinic  4) Please reach out for contraception options

## 2024-02-22 NOTE — Assessment & Plan Note (Signed)
 Uncontrolled. Will increase labetalol to 300mg  BID. Followup later this week or early next week for repeat BP and HR.

## 2024-02-22 NOTE — Progress Notes (Signed)
    SUBJECTIVE:   CHIEF COMPLAINT / HPI:   Vickie Gonzales is a 38yo F w/ hx of HTN, T2DM, tobacco use that p/f N/V.  - Throwing up for past 2 weeks. Vomits about twice a day. No vomiting today. Was able to eat a meal and drinks today. - Last period around 2/12. Reports having regular monthly periods before that.  - Not on contraception - Upreg positive today in clinic. This is unexpected. - Denies any other symptoms such as vaginal discharge or bleeding, abdominal cramping.  - Has had terminations in the past. Pt desires a termination. - Pt is worried about missing work for needing a termination or other visits related to this.   HTN  Tachycardia - reports missing labetalol this morning.  Medications - Only taking labetalol.  - Not taking ibuprofen, prozac, lexapro, gabepentin - Reports tobacco use. Denies alcohol and other drug use  OBJECTIVE:   BP (!) 165/104   Pulse (!) 121   Wt 159 lb 9.6 oz (72.4 kg)   LMP 01/05/2024   SpO2 100%   BMI 26.56 kg/m   General: Alert, pleasant well appearing woman. Drinking a cup of water and tolerating. NAD. HEENT: NCAT. MMM. CV: RRR, no murmurs. Cap refill <2. Resp: CTAB, no wheezing or crackles. Normal WOB on RA.  Abm: Soft, nontender, nondistended. BS present. Ext: Moves all ext spontaneously Skin: Warm, well perfused   ASSESSMENT/PLAN:   Assessment & Plan Positive pregnancy test Upreg positive. LMP ~2/12, thus estimated ~[redacted]wk GA. Desires termination. - Provided termination resources. Encouraged to get in contact as soon as possible - Reviewed medications and counseled on avoiding teratogenic medications until termination is completed - counseled pt on getting FMLA paperwork if she needs for termination/pregnancy related care.  - Counseled to come back for contraception counseling or going to health department if patient is unable to afford Nausea and vomiting, unspecified vomiting type Most likely hyperemesis given positive Upreg. Hydrated  on exam and tolerating PO. - Encouraged fluid hydration - Start Diclegis 2 tablets nightly for N/V Primary hypertension Uncontrolled. Will increase labetalol to 300mg  BID. Followup later this week or early next week for repeat BP and HR.    Lincoln Brigham, MD [facility]

## 2024-02-24 ENCOUNTER — Other Ambulatory Visit (HOSPITAL_COMMUNITY): Payer: Self-pay

## 2024-11-27 ENCOUNTER — Encounter (HOSPITAL_COMMUNITY): Payer: Self-pay

## 2024-11-27 ENCOUNTER — Emergency Department (HOSPITAL_COMMUNITY)
Admission: EM | Admit: 2024-11-27 | Discharge: 2024-11-27 | Discharge status: Against Medical Advice | Attending: Emergency Medicine | Admitting: Emergency Medicine

## 2024-11-27 DIAGNOSIS — Z5321 Procedure and treatment not carried out due to patient leaving prior to being seen by health care provider: Secondary | ICD-10-CM | POA: Diagnosis not present

## 2024-11-27 DIAGNOSIS — R531 Weakness: Secondary | ICD-10-CM | POA: Insufficient documentation

## 2024-11-27 DIAGNOSIS — R110 Nausea: Secondary | ICD-10-CM | POA: Insufficient documentation

## 2024-11-27 LAB — URINALYSIS, ROUTINE W REFLEX MICROSCOPIC
Bacteria, UA: NONE SEEN
Bilirubin Urine: NEGATIVE
Glucose, UA: >=500 mg/dL — AB
Hgb urine dipstick: NEGATIVE
Ketones, ur: NEGATIVE mg/dL
Leukocytes,Ua: NEGATIVE
Nitrite: NEGATIVE
Protein, ur: NEGATIVE mg/dL
Specific Gravity, Urine: 1.029 (ref 1.005–1.030)
pH: 6 (ref 5.0–8.0)

## 2024-11-27 LAB — COMPREHENSIVE METABOLIC PANEL WITH GFR
ALT: 12 U/L (ref 0–44)
AST: 20 U/L (ref 15–41)
Albumin: 4.4 g/dL (ref 3.5–5.0)
Alkaline Phosphatase: 114 U/L (ref 38–126)
Anion gap: 10 (ref 5–15)
BUN: 11 mg/dL (ref 6–20)
CO2: 22 mmol/L (ref 22–32)
Calcium: 8.8 mg/dL — ABNORMAL LOW (ref 8.9–10.3)
Chloride: 104 mmol/L (ref 98–111)
Creatinine, Ser: 0.73 mg/dL (ref 0.44–1.00)
GFR, Estimated: >60 mL/min
Glucose, Bld: 346 mg/dL — ABNORMAL HIGH (ref 70–99)
Potassium: 3.6 mmol/L (ref 3.5–5.1)
Sodium: 136 mmol/L (ref 135–145)
Total Bilirubin: 0.5 mg/dL (ref 0.0–1.2)
Total Protein: 7.5 g/dL (ref 6.5–8.1)

## 2024-11-27 LAB — CBC
HCT: 45.7 % (ref 36.0–46.0)
Hemoglobin: 15.3 g/dL — ABNORMAL HIGH (ref 12.0–15.0)
MCH: 33.1 pg (ref 26.0–34.0)
MCHC: 33.5 g/dL (ref 30.0–36.0)
MCV: 98.9 fL (ref 80.0–100.0)
Platelets: 333 K/uL (ref 150–400)
RBC: 4.62 MIL/uL (ref 3.87–5.11)
RDW: 13.2 % (ref 11.5–15.5)
WBC: 14.8 K/uL — ABNORMAL HIGH (ref 4.0–10.5)
nRBC: 0 % (ref 0.0–0.2)

## 2024-11-27 LAB — HCG, SERUM, QUALITATIVE: Preg, Serum: NEGATIVE

## 2024-11-27 NOTE — ED Triage Notes (Signed)
 Pt arrived via EMS, from home. States feels weak and nauseated. Attempted to lay down but did not feel better.

## 2024-11-27 NOTE — ED Notes (Signed)
 Patient came up to window and requested IV be taken out. Patient wanted to leave. Triage RN made aware.

## 2024-12-05 ENCOUNTER — Other Ambulatory Visit: Payer: Self-pay

## 2024-12-05 NOTE — Telephone Encounter (Signed)
 Patient calls nurse line requesting a refill on BP and anxiety medications.   She reports she made an apt for 1/9, however wanted to restart these medications sooner.   Patient rescheduled for tomorrow for a sooner apt to discuss medications.   Patient was appreciative.

## 2024-12-06 ENCOUNTER — Ambulatory Visit (INDEPENDENT_AMBULATORY_CARE_PROVIDER_SITE_OTHER)

## 2024-12-06 VITALS — BP 124/82 | HR 123 | Ht 64.0 in | Wt 169.0 lb

## 2024-12-06 DIAGNOSIS — Z3009 Encounter for other general counseling and advice on contraception: Secondary | ICD-10-CM

## 2024-12-06 DIAGNOSIS — F339 Major depressive disorder, recurrent, unspecified: Secondary | ICD-10-CM | POA: Diagnosis present

## 2024-12-06 DIAGNOSIS — I10 Essential (primary) hypertension: Secondary | ICD-10-CM

## 2024-12-06 DIAGNOSIS — R000 Tachycardia, unspecified: Secondary | ICD-10-CM

## 2024-12-06 DIAGNOSIS — F17200 Nicotine dependence, unspecified, uncomplicated: Secondary | ICD-10-CM

## 2024-12-06 DIAGNOSIS — F419 Anxiety disorder, unspecified: Secondary | ICD-10-CM | POA: Diagnosis not present

## 2024-12-06 MED ORDER — LABETALOL HCL 300 MG PO TABS: 300.0000 mg | ORAL_TABLET | Freq: Two times a day (BID) | ORAL | 3 refills | Status: AC

## 2024-12-06 MED ORDER — SERTRALINE HCL 50 MG PO TABS: 50.0000 mg | ORAL_TABLET | Freq: Every day | ORAL | 3 refills | Status: AC

## 2024-12-06 MED ORDER — NORGESTIMATE-ETH ESTRADIOL 0.25-35 MG-MCG PO TABS: 1.0000 | ORAL_TABLET | Freq: Every day | ORAL | 11 refills | Status: AC

## 2024-12-06 MED ORDER — NICOTINE 14 MG/24HR TD PT24: 14.0000 mg | MEDICATED_PATCH | Freq: Every day | TRANSDERMAL | 0 refills | Status: AC

## 2024-12-06 NOTE — Progress Notes (Signed)
" ° ° °  SUBJECTIVE:   CHIEF COMPLAINT / HPI:   Notes she has had a lot of worry and anxiety creeping up on her.  She does note guilt, anhedonia, social withdrawal minimally, but the constant worrying is her biggest concern.  A lot of life stressors at this time including eviction notice, lost job, and car troubles, in addition to being a single mother at baseline.  She wants to get ahead of her mood symptoms because it has been overwhelming for her in the past.  She has a lot to live for, and emphasizes no current or prior thoughts of self-harm.  She states she could never do that to her children.  She has good social support.  She has a low threshold to reach out for help if she feels overwhelmed.  Review of notes from 08/27/2021, started on Prozac  and recommended to seek therapy for depression. No follow up for anxiety/depression noted since then. Prior to 2022 she was prescribed Lexapro  10 mg daily for anx/dep and referred to behavioral health. Did not like prozac /lexapro  in the past due to feeling faint.   PERTINENT  PMH / PSH: anxiety, depression  OBJECTIVE:   BP 124/82   Pulse (!) 123   Ht 5' 4 (1.626 m)   Wt 169 lb (76.7 kg)   LMP 12/04/2024   SpO2 98%   BMI 29.01 kg/m    General: Well appearing but anxious female, in no acute distress CV: Tachycardic with heart rate improved to 110 with interventional techniques, regular rhythm, no murmurs, 2+ radial pulses bilaterally Pulm: CTAB, no increased work of breathing Psych: Speech rapid, good insight, thought content appropriate and goal oriented, intermittently tearful appropriately, no SI  ASSESSMENT/PLAN:   Assessment & Plan Depression, recurrent Anxiety PHQ9: 16 GAD7: 16 Provided prescription for sertraline  50 mg daily as she did not like Prozac  or Lexapro  in the past. Recommended exercise, goal set.  Also advised patient on breathing and calming behavioral techniques, which were immediately beneficial for her. Therapy  resources for Medicaid provided and referral to psychology to assist patient in getting established with counseling.  Patient has no SI, self-harm thoughts at this time, but safety plan developed nonetheless. Follow up next week with Dr. Orie to evaluate symptoms.  She will also need ongoing close follow-up for chronic condition management and care gaps. Primary hypertension Refilled labetalol  300 mg twice daily.  Blood pressure goal less than 130/80, slightly above goal today.  I suspect the labetalol  will also help with her mood symptoms. Tachycardia Suspect due to anxiety.  Heart rate improved throughout visit with interventional techniques described above.  EKG revealing sinus tachycardia, heart rate 101. TOBACCO USER Smoking cessation discussed, patient interested.  Around 1 pack/day. nicotine  patch prescribed and quit line card given. Birth control counseling Patient not currently on any birth control but desiring to avoid pregnancy.  First day LMP 12/04/2024.  She has tried OCPs in the past, and is interested in this again.  Prescription for Sprintec provided today.  She is currently on her menstrual period, she will initiate this medication soon as her period ends and it will be immediately effective.    Vickie Suell Alena Morrison, MD [facility] "

## 2024-12-06 NOTE — Assessment & Plan Note (Signed)
 Suspect due to anxiety.  Heart rate improved throughout visit with interventional techniques described above.  EKG revealing sinus tachycardia, heart rate 101.

## 2024-12-06 NOTE — Patient Instructions (Addendum)
 We worked on deep breaths and box breathing today (breath in 4 seconds, hold for 4 seconds) Counting to 10, 20, or 30 to help calm down Exercise, continue doing this. That is great! Walking 15 minutes every day. Start taking Sertraline  once daily. This will take 4-6 weeks to become more effective.   Safety Plan: If struggling with mood, Call [facility] [phone] Will try a bubble bath to calm down Call [facility], [phone] If you want to kill yourself, Call 911 right away  Therapy and Counseling Resources Most providers on this list will take Medicaid. Patients with commercial insurance or Medicare should contact their insurance company to get a list of in network providers.  [facility] (takes children) Location 1: [address] [place], [place] [address] Location 2: [address] [place], [place] [address] [phone]   [facility] (spanish speaking therapist available)(habla espanol)(take medicare and medicaid)  [address], [place], [place] [address], [place]  [email or web address] [phone]  [facility] [address] [place], [place] [address] [phone]  [facility]   [address], [address] [place], [place] [address]      [phone]  [facility] (spanish available) [address]  [place], [place] [address] [phone]  [facility] (take [facility] and medicare) [address]  [place], [place] [address]       [phone]     [facility] (virtual only) [phone]  [facility] [address], [place], [place] [phone]  [facility]:  [address] [place] [phone]  [facility]:  [address] [place] [phone]  [facility] (under & uninsured) [address]   [place] [phone]     [email or web address]    [facility] [address]  [place]    [phone]  [facility] -[address]     Phone:  [phone]     [facility]-  [address]  [phone]   [facility] [address]      [place], [place] [phone]  [facility]: [address] [place], [place], [place]  [phone]   [facility] (Spanish therapist) [email or web address]  [address] [place]  [address]   [place] [address]    [phone]    [facility]   [address],  [place], [place]  [phone]   [facility]  [address] [place]  [phone]  [facility]  [address] [place], [place]        [phone]  Open Access/Walk In Clinic under & uninsured  [facility]  [address] [place], [place] [phone] Crisis [phone]  [facility] [address],  (Spanish)   [address] , [place]: [phone]) 8:30 - 12; 1 - 2:30  [facility] [employer],  [address] [place], [place]    [phone]):8:30 - 12; 2 - 3PM  [facility] [employer],  [address],  [place]; [phone]):   Mon - Fri 8 AM - 5 PM  [facility] [address] [place]  MWF 12:30 to 3:00 or call to schedule an appointment  [phone]  Specific Provider options Psychology Today  [email or web address]  click on find a therapist  enter  your zip code left side and select or tailor a therapist for your specific need.   [facility] Provider Directory [email or web address]  (Medicaid)   Follow all drop down to find a provider  Social Support program [facility] [place] [phone] or photosolver.pl [address], [place], [place] Recovery support and educational   24- Hour  Availability:   [facility]  [address] [place], [place] [phone] Crisis [phone]  [facility] [phone]  [place] [facility]  [phone]   [facility]  [phone] (after hours)  Therapeutic Alternative/Mobile Crisis   [phone]  [place]  National Suicide Hotline  431-337-4049 MERRILYN)  Call 911 or go to emergency room  [facility]  [phone]);  [place]  [phone]); [place], [place], [place], [place], [place], [place], [place]

## 2024-12-06 NOTE — Assessment & Plan Note (Addendum)
 PHQ9: 16 GAD7: 16 Provided prescription for sertraline  50 mg daily as she did not like Prozac  or Lexapro  in the past. Recommended exercise, goal set.  Also advised patient on breathing and calming behavioral techniques, which were immediately beneficial for her. Therapy resources for Medicaid provided and referral to psychology to assist patient in getting established with counseling.  Patient has no SI, self-harm thoughts at this time, but safety plan developed nonetheless. Follow up next week with Dr. Orie to evaluate symptoms.  She will also need ongoing close follow-up for chronic condition management and care gaps.

## 2024-12-06 NOTE — Assessment & Plan Note (Signed)
 Smoking cessation discussed, patient interested.  Around 1 pack/day. nicotine  patch prescribed and quit line card given.

## 2024-12-06 NOTE — Assessment & Plan Note (Signed)
 Refilled labetalol  300 mg twice daily.  Blood pressure goal less than 130/80, slightly above goal today.  I suspect the labetalol  will also help with her mood symptoms.

## 2024-12-08 ENCOUNTER — Encounter: Payer: Self-pay | Admitting: Family Medicine

## 2024-12-14 ENCOUNTER — Ambulatory Visit
